# Patient Record
Sex: Female | Born: 1945 | Race: White | Hispanic: No | Smoking: Never smoker
Health system: Southern US, Community
[De-identification: ages and names within clinical notes are randomized; demographics above are authoritative.]

## PROBLEM LIST (undated history)

## (undated) DIAGNOSIS — M1990 Unspecified osteoarthritis, unspecified site: Secondary | ICD-10-CM

## (undated) DIAGNOSIS — K219 Gastro-esophageal reflux disease without esophagitis: Secondary | ICD-10-CM

## (undated) DIAGNOSIS — E079 Disorder of thyroid, unspecified: Secondary | ICD-10-CM

## (undated) DIAGNOSIS — R42 Dizziness and giddiness: Secondary | ICD-10-CM

## (undated) DIAGNOSIS — R001 Bradycardia, unspecified: Secondary | ICD-10-CM

## (undated) DIAGNOSIS — R0600 Dyspnea, unspecified: Secondary | ICD-10-CM

## (undated) HISTORY — DX: Gastro-esophageal reflux disease without esophagitis: K21.9

## (undated) HISTORY — DX: Unspecified osteoarthritis, unspecified site: M19.90

## (undated) HISTORY — DX: Dizziness and giddiness: R42

## (undated) HISTORY — PX: OTHER SURGICAL HISTORY: SHX169

## (undated) HISTORY — DX: Bradycardia, unspecified: R00.1

## (undated) HISTORY — DX: Dyspnea, unspecified: R06.00

## (undated) HISTORY — PX: SKIN CANCER EXCISION: SHX779

## (undated) HISTORY — PX: ABDOMINAL HYSTERECTOMY: SHX81

---

## 2000-11-06 ENCOUNTER — Ambulatory Visit (HOSPITAL_COMMUNITY): Admission: RE | Admit: 2000-11-06 | Discharge: 2000-11-06 | Payer: Self-pay | Admitting: Family Medicine

## 2000-11-06 ENCOUNTER — Encounter: Payer: Self-pay | Admitting: Family Medicine

## 2000-12-22 ENCOUNTER — Encounter: Payer: Self-pay | Admitting: *Deleted

## 2000-12-22 ENCOUNTER — Ambulatory Visit (HOSPITAL_COMMUNITY): Admission: RE | Admit: 2000-12-22 | Discharge: 2000-12-22 | Payer: Self-pay | Admitting: *Deleted

## 2001-09-06 ENCOUNTER — Ambulatory Visit (HOSPITAL_COMMUNITY): Admission: RE | Admit: 2001-09-06 | Discharge: 2001-09-06 | Payer: Self-pay | Admitting: Family Medicine

## 2001-09-06 ENCOUNTER — Encounter: Payer: Self-pay | Admitting: Family Medicine

## 2002-02-16 ENCOUNTER — Ambulatory Visit (HOSPITAL_COMMUNITY): Admission: RE | Admit: 2002-02-16 | Discharge: 2002-02-16 | Payer: Self-pay | Admitting: Family Medicine

## 2002-02-16 ENCOUNTER — Encounter: Payer: Self-pay | Admitting: Family Medicine

## 2002-06-01 ENCOUNTER — Encounter: Payer: Self-pay | Admitting: Family Medicine

## 2002-06-01 ENCOUNTER — Ambulatory Visit (HOSPITAL_COMMUNITY): Admission: RE | Admit: 2002-06-01 | Discharge: 2002-06-01 | Payer: Self-pay | Admitting: Family Medicine

## 2003-06-05 ENCOUNTER — Ambulatory Visit (HOSPITAL_COMMUNITY): Admission: RE | Admit: 2003-06-05 | Discharge: 2003-06-05 | Payer: Self-pay | Admitting: Family Medicine

## 2003-06-21 ENCOUNTER — Ambulatory Visit (HOSPITAL_COMMUNITY): Admission: RE | Admit: 2003-06-21 | Discharge: 2003-06-21 | Payer: Self-pay | Admitting: Family Medicine

## 2003-10-17 ENCOUNTER — Ambulatory Visit (HOSPITAL_COMMUNITY): Admission: RE | Admit: 2003-10-17 | Discharge: 2003-10-17 | Payer: Self-pay | Admitting: Diagnostic Radiology

## 2004-02-29 ENCOUNTER — Ambulatory Visit (HOSPITAL_COMMUNITY): Admission: RE | Admit: 2004-02-29 | Discharge: 2004-02-29 | Payer: Self-pay | Admitting: Family Medicine

## 2005-01-09 ENCOUNTER — Ambulatory Visit (HOSPITAL_COMMUNITY): Admission: RE | Admit: 2005-01-09 | Discharge: 2005-01-09 | Payer: Self-pay | Admitting: Family Medicine

## 2006-04-08 ENCOUNTER — Ambulatory Visit: Payer: Self-pay | Admitting: General Practice

## 2007-04-14 ENCOUNTER — Ambulatory Visit: Payer: Self-pay | Admitting: General Practice

## 2007-06-07 ENCOUNTER — Ambulatory Visit (HOSPITAL_COMMUNITY): Admission: RE | Admit: 2007-06-07 | Discharge: 2007-06-07 | Payer: Self-pay | Admitting: Family Medicine

## 2008-04-19 ENCOUNTER — Ambulatory Visit: Payer: Self-pay | Admitting: General Practice

## 2008-10-20 ENCOUNTER — Ambulatory Visit (HOSPITAL_COMMUNITY): Admission: RE | Admit: 2008-10-20 | Discharge: 2008-10-20 | Payer: Self-pay | Admitting: Internal Medicine

## 2009-04-24 ENCOUNTER — Ambulatory Visit: Payer: Self-pay | Admitting: General Practice

## 2010-01-02 ENCOUNTER — Encounter: Payer: Self-pay | Admitting: Internal Medicine

## 2010-01-21 ENCOUNTER — Ambulatory Visit: Payer: Self-pay | Admitting: Internal Medicine

## 2010-01-21 ENCOUNTER — Ambulatory Visit (HOSPITAL_COMMUNITY): Admission: RE | Admit: 2010-01-21 | Discharge: 2010-01-21 | Payer: Self-pay | Admitting: Internal Medicine

## 2010-01-21 HISTORY — PX: COLONOSCOPY W/ POLYPECTOMY: SHX1380

## 2010-01-26 ENCOUNTER — Encounter: Payer: Self-pay | Admitting: Internal Medicine

## 2010-02-18 ENCOUNTER — Encounter: Payer: Self-pay | Admitting: Internal Medicine

## 2010-04-16 ENCOUNTER — Ambulatory Visit: Payer: Self-pay | Admitting: General Practice

## 2010-07-14 ENCOUNTER — Encounter: Payer: Self-pay | Admitting: Family Medicine

## 2010-07-23 NOTE — Letter (Signed)
Summary: PATHOLOGY REPORT  PATHOLOGY REPORT   Imported By: Rexene Alberts 02/18/2010 10:40:43  _____________________________________________________________________  External Attachment:    Type:   Image     Comment:   External Document

## 2010-07-23 NOTE — Letter (Signed)
Summary: Patient Notice, Colon Biopsy Results  [facility]  [address]   [place], [place] [address]   Phone: [phone]  Fax: [phone]       January 26, 2010   Robin Preston [address] [place], [place]  [address] July 10, 1945    Dear Robin Preston,  I am pleased to inform you that the biopsies taken during your recent colonoscopy did not show any evidence of cancer upon pathologic examination.  Additional information/recommendations:  No further action is needed at this time.  Please follow-up with your primary care physician for your other healthcare needs.  You should have a repeat colonoscopy examination  in 7 years.  Please call us if you are having persistent problems or have questions about your condition that have not been fully answered at this time.  Sincerely,    R. Roetta Sessions MD, FACP [facility] Ph: [phone]    Fax: [phone]   Appended Document: Patient Notice, Colon Biopsy Results letter mailed to pt  Appended Document: Patient Notice, Colon Biopsy Results REMINDER IN COMPUTER

## 2010-07-23 NOTE — Letter (Signed)
Summary: Internal Other Domingo Dimes  Internal Other Domingo Dimes   Imported By: Cloria Spring LPN 04/54/0981 19:14:78  _____________________________________________________________________  External Attachment:    Type:   Image     Comment:   External Document

## 2010-07-23 NOTE — Letter (Signed)
Summary: OPERATIVE REPORT  OPERATIVE REPORT   Imported By: Rexene Alberts 02/18/2010 10:39:25  _____________________________________________________________________  External Attachment:    Type:   Image     Comment:   External Document

## 2010-10-02 ENCOUNTER — Ambulatory Visit: Payer: Self-pay | Admitting: Cardiology

## 2010-11-08 NOTE — Procedures (Signed)
Robin Preston, Robin Preston                          ACCOUNT NO.:  [id number]   MEDICAL RECORD NO.:  [id number]                   PATIENT TYPE:  OUT   LOCATION:  RESP                                 FACILITY:  [facility]   PHYSICIAN:  Edward L. Juanetta Gosling, M.D.             DATE OF BIRTH:  05/12/1946   DATE OF PROCEDURE:  DATE OF DISCHARGE:  02/29/2004                              PULMONARY FUNCTION TEST   RESULTS:  1.  Spirometry is normal.  2.  Lung volumes are minimally decreased.  Total lung capacity is minimally      decreased with normal being 80% and this patient's measurement is at      79.3.  3.  DLCO is mildly decreased.  4.  Arterial blood gases are normal.      ___________________________________________                                            Oneal Deputy. Juanetta Gosling, M.D.   ELH/MEDQ  D:  03/06/2004  T:  03/06/2004  Job:  [id number]

## 2011-04-15 ENCOUNTER — Ambulatory Visit: Payer: Self-pay

## 2011-05-06 ENCOUNTER — Ambulatory Visit: Payer: Self-pay

## 2011-09-21 ENCOUNTER — Encounter (HOSPITAL_COMMUNITY): Payer: Self-pay | Admitting: *Deleted

## 2011-09-21 ENCOUNTER — Emergency Department (HOSPITAL_COMMUNITY)
Admission: EM | Admit: 2011-09-21 | Discharge: 2011-09-21 | Disposition: A | Discharge status: Home / Self Care | Payer: BC Managed Care – PPO | Attending: Emergency Medicine | Admitting: Emergency Medicine

## 2011-09-21 ENCOUNTER — Other Ambulatory Visit: Payer: Self-pay

## 2011-09-21 ENCOUNTER — Emergency Department (HOSPITAL_COMMUNITY): Payer: BC Managed Care – PPO

## 2011-09-21 DIAGNOSIS — R0609 Other forms of dyspnea: Secondary | ICD-10-CM | POA: Insufficient documentation

## 2011-09-21 DIAGNOSIS — R079 Chest pain, unspecified: Secondary | ICD-10-CM | POA: Insufficient documentation

## 2011-09-21 DIAGNOSIS — R0602 Shortness of breath: Secondary | ICD-10-CM | POA: Insufficient documentation

## 2011-09-21 DIAGNOSIS — R0989 Other specified symptoms and signs involving the circulatory and respiratory systems: Secondary | ICD-10-CM | POA: Insufficient documentation

## 2011-09-21 DIAGNOSIS — R0600 Dyspnea, unspecified: Secondary | ICD-10-CM

## 2011-09-21 DIAGNOSIS — I498 Other specified cardiac arrhythmias: Secondary | ICD-10-CM | POA: Insufficient documentation

## 2011-09-21 HISTORY — DX: Disorder of thyroid, unspecified: E07.9

## 2011-09-21 LAB — COMPREHENSIVE METABOLIC PANEL
ALT: 19 U/L (ref 0–35)
AST: 18 U/L (ref 0–37)
CO2: 26 mEq/L (ref 19–32)
Calcium: 9.4 mg/dL (ref 8.4–10.5)
Chloride: 98 mEq/L (ref 96–112)
GFR calc Af Amer: >90 mL/min (ref >90)
GFR calc non Af Amer: 87 mL/min — ABNORMAL LOW (ref >90)
Glucose, Bld: 86 mg/dL (ref 70–99)
Sodium: 134 mEq/L — ABNORMAL LOW (ref 135–145)
Total Bilirubin: 0.2 mg/dL — ABNORMAL LOW (ref 0.3–1.2)

## 2011-09-21 LAB — DIFFERENTIAL
Basophils Absolute: 0 10*3/uL (ref 0.0–0.1)
Eosinophils Relative: 0 % (ref 0–5)
Lymphocytes Relative: 34 % (ref 12–46)
Lymphs Abs: 1.6 10*3/uL (ref 0.7–4.0)
Monocytes Absolute: 0.4 10*3/uL (ref 0.1–1.0)
Neutro Abs: 2.6 10*3/uL (ref 1.7–7.7)

## 2011-09-21 LAB — CBC
HCT: 37 % (ref 36.0–46.0)
MCV: 87.7 fL (ref 78.0–100.0)
Platelets: 203 10*3/uL (ref 150–400)
RBC: 4.22 MIL/uL (ref 3.87–5.11)
WBC: 4.7 10*3/uL (ref 4.0–10.5)

## 2011-09-21 LAB — TROPONIN I: Troponin I: <0.3 ng/mL (ref <0.30)

## 2011-09-21 NOTE — ED Provider Notes (Signed)
History     CSN: [id number]  Arrival date & time 09/21/11  1552   First MD Initiated Contact with Patient 09/21/11 1616      Chief Complaint  Patient presents with  . Shortness of Breath    (Consider location/radiation/quality/duration/timing/severity/associated sxs/prior treatment) Patient is a 66 y.o. female presenting with shortness of breath. The history is provided by the patient (the patient states that she has been having shortness of breath for a week. The patient states shortness of breath is worse with exertion. She's not having any chest pain.). No language interpreter was used.  Shortness of Breath  The current episode started 5 to 7 days ago. The problem occurs frequently. The problem has been unchanged. The problem is moderate. The symptoms are relieved by nothing. The symptoms are aggravated by activity. Associated symptoms include chest pain and shortness of breath. Pertinent negatives include no cough. She was not exposed to toxic fumes. She has had no prior steroid use. Her past medical history does not include asthma. She has been behaving normally.    Past Medical History  Diagnosis Date  . Thyroid disease     Past Surgical History  Procedure Date  . Abdominal hysterectomy     No family history on file.  History  Substance Use Topics  . Smoking status: Never Smoker   . Smokeless tobacco: Not on file  . Alcohol Use: No    OB History    Grav Para Term Preterm Abortions TAB SAB Ect Mult Living                  Review of Systems  Constitutional: Negative for fatigue.  HENT: Negative for congestion, sinus pressure and ear discharge.   Eyes: Negative for discharge.  Respiratory: Positive for shortness of breath. Negative for cough.   Cardiovascular: Positive for chest pain.  Gastrointestinal: Negative for abdominal pain and diarrhea.  Genitourinary: Negative for frequency and hematuria.  Musculoskeletal: Negative for back pain.  Skin: Negative for  rash.  Neurological: Negative for seizures and headaches.  Hematological: Negative.   Psychiatric/Behavioral: Negative for hallucinations.    Allergies  Sulfa antibiotics  Home Medications   Current Outpatient Rx  Name Route Sig Dispense Refill  . ESTROGENS CONJUGATED 0.625 MG PO TABS Oral Take 0.625 mg by mouth daily. Take daily for 21 days then do not take for 7 days.    Marland Kitchen METHIMAZOLE 10 MG PO TABS Oral Take 10 mg by mouth daily.      BP 110/64  Pulse 52  Temp(Src) 97.5 F (36.4 C) (Oral)  Resp 18  Ht 5\' 1"  (1.549 m)  Wt 92 lb (41.731 kg)  BMI 17.38 kg/m2  SpO2 100%  Physical Exam  Constitutional: She is oriented to person, place, and time. She appears well-developed.  HENT:  Head: Normocephalic and atraumatic.  Eyes: Conjunctivae and EOM are normal. No scleral icterus.  Neck: Neck supple. No thyromegaly present.  Cardiovascular: Normal rate and regular rhythm.  Exam reveals no gallop and no friction rub.   No murmur heard. Pulmonary/Chest: No stridor. She has no wheezes. She has no rales. She exhibits no tenderness.  Abdominal: She exhibits no distension. There is no tenderness. There is no rebound.  Musculoskeletal: Normal range of motion. She exhibits no edema.  Lymphadenopathy:    She has no cervical adenopathy.  Neurological: She is oriented to person, place, and time. Coordination normal.  Skin: No rash noted. No erythema.  Psychiatric: She has a normal mood  and affect. Her behavior is normal.    ED Course  Procedures (including critical care time)  Labs Reviewed  COMPREHENSIVE METABOLIC PANEL - Abnormal; Notable for the following:    Sodium 134 (*)    Total Bilirubin 0.2 (*)    GFR calc non Af Amer 87 (*)    All other components within normal limits  D-DIMER, QUANTITATIVE  TROPONIN I  CBC  DIFFERENTIAL   Dg Chest 2 View  09/21/2011  *RADIOLOGY REPORT*  Clinical Data: Shortness of breath  CHEST - 2 VIEW  Comparison: None.  Findings: The heart size  and mediastinal contours are within normal limits.  Both lungs are clear.  The visualized skeletal structures are unremarkable.  IMPRESSION:  Negative exam.  Original Report Authenticated By: Rosealee Albee, M.D.   Pt told that we should admit her and possibly do a treadmill test.  She refused admission  1. Dyspnea      Date: 09/21/2011  Rate: 48  Rhythm: sinus bradycardia  QRS Axis: normal  Intervals: normal  ST/T Wave abnormalities: normal  Conduction Disutrbances:none  Narrative Interpretation:   Old EKG Reviewed: unchanged \  MDM   Dyspnea unknown cause.  Pt refusing admission      Benny Lennert, MD 09/21/11 [phone]

## 2011-09-21 NOTE — ED Notes (Signed)
Pt c/o shortness of breath x 1 1/2 weeks. Pt states that she had vomiting and diarrhea several days ago but this started prior to that. Pt alert and oriented x 3. Skin warm and dry. Color pink. Breath sounds clear and equal bilaterally. Cardiac monitor showing SB with rate of 47. Denies cough and congestion.

## 2011-09-21 NOTE — ED Notes (Signed)
C/o increased sob x 1 wk.  Denies cough, denies fever.  Speaking clear full sentences in triage, RR wnl and unlabored.

## 2011-09-21 NOTE — Discharge Instructions (Signed)
Follow up with your md this week.  Return if any problems °

## 2012-03-09 ENCOUNTER — Ambulatory Visit: Payer: BC Managed Care – PPO | Admitting: Cardiology

## 2012-03-16 ENCOUNTER — Encounter: Payer: Self-pay | Admitting: Cardiology

## 2012-03-16 ENCOUNTER — Encounter: Payer: Self-pay | Admitting: *Deleted

## 2012-03-16 ENCOUNTER — Ambulatory Visit (INDEPENDENT_AMBULATORY_CARE_PROVIDER_SITE_OTHER): Payer: BC Managed Care – PPO | Admitting: Cardiology

## 2012-03-16 VITALS — BP 120/70 | HR 49 | Ht 61.0 in | Wt 96.0 lb

## 2012-03-16 DIAGNOSIS — Z9289 Personal history of other medical treatment: Secondary | ICD-10-CM | POA: Insufficient documentation

## 2012-03-16 DIAGNOSIS — R0989 Other specified symptoms and signs involving the circulatory and respiratory systems: Secondary | ICD-10-CM

## 2012-03-16 DIAGNOSIS — R001 Bradycardia, unspecified: Secondary | ICD-10-CM

## 2012-03-16 DIAGNOSIS — R0600 Dyspnea, unspecified: Secondary | ICD-10-CM

## 2012-03-16 DIAGNOSIS — R42 Dizziness and giddiness: Secondary | ICD-10-CM

## 2012-03-16 DIAGNOSIS — I498 Other specified cardiac arrhythmias: Secondary | ICD-10-CM

## 2012-03-16 NOTE — Patient Instructions (Addendum)
Your physician recommends that you schedule a follow-up appointment in: As needed   Your physician has requested that you have a stress echocardiogram. For further information please visit [email or web address]. Please follow instruction sheet as given.    

## 2012-03-16 NOTE — Progress Notes (Deleted)
Name: Robin Preston    DOB: [birth year]  Age: 66 y.o.  MR#: [id number]       PCP:  Cassell Smiles., MD      Insurance: @PAYORNAME @   CC:   No chief complaint on file.   VS BP 120/70  Pulse 49  Ht 5\' 1"  (1.549 m)  Wt 96 lb (43.545 kg)  BMI 18.14 kg/m2  SpO2 95%  Weights Current Weight  03/16/12 96 lb (43.545 kg)  09/21/11 92 lb (41.731 kg)    Blood Pressure  BP Readings from Last 3 Encounters:  03/16/12 120/70  09/21/11 110/64     Admit date:  (Not on file) Last encounter with RMR:  Visit date not found   Allergy Allergies  Allergen Reactions  . Sulfa Antibiotics Rash    Current Outpatient Prescriptions  Medication Sig Dispense Refill  . estrogens, conjugated, (PREMARIN) 0.625 MG tablet Take 0.625 mg by mouth daily. Take daily for 21 days then do not take for 7 days.      Marland Kitchen HYDROcodone-acetaminophen (NORCO/VICODIN) 5-325 MG per tablet Take 1 tablet by mouth every 6 (six) hours as needed.      . methimazole (TAPAZOLE) 10 MG tablet Take 10 mg by mouth daily.        Discontinued Meds:   There are no discontinued medications.  Patient Active Problem List  Diagnosis  . History of diagnostic tests    LABS No visits with results within 3 Month(s) from this visit. Latest known visit with results is:  Admission on 09/21/2011, Discharged on 09/21/2011  Component Date Value  . D-Dimer, Quant 09/21/2011 <0.22   . Troponin I 09/21/2011 <0.30   . WBC 09/21/2011 4.7   . RBC 09/21/2011 4.22   . Hemoglobin 09/21/2011 12.8   . HCT 09/21/2011 37.0   . MCV 09/21/2011 87.7   . [facility] 09/21/2011 30.3   . MCHC 09/21/2011 34.6   . RDW 09/21/2011 12.5   . Platelets 09/21/2011 203   . Neutrophils Relative 09/21/2011 56   . Neutro Abs 09/21/2011 2.6   . Lymphocytes Relative 09/21/2011 34   . Lymphs Abs 09/21/2011 1.6   . Monocytes Relative 09/21/2011 9   . Monocytes Absolute 09/21/2011 0.4   . Eosinophils Relative 09/21/2011 0   . Eosinophils Absolute 09/21/2011 0.0   .  Basophils Relative 09/21/2011 1   . Basophils Absolute 09/21/2011 0.0   . Sodium 09/21/2011 134*  . Potassium 09/21/2011 4.0   . Chloride 09/21/2011 98   . CO2 09/21/2011 26   . Glucose, Bld 09/21/2011 86   . BUN 09/21/2011 12   . Creatinine, Ser 09/21/2011 0.72   . Calcium 09/21/2011 9.4   . Total Protein 09/21/2011 6.6   . Albumin 09/21/2011 3.5   . AST 09/21/2011 18   . ALT 09/21/2011 19   . Alkaline Phosphatase 09/21/2011 44   . Total Bilirubin 09/21/2011 0.2*  . GFR calc non Af Amer 09/21/2011 87*  . GFR calc Af Amer 09/21/2011 >90      Results for this Opt Visit:     Results for orders placed during the hospital encounter of 09/21/11  D-DIMER, QUANTITATIVE      Component Value Range   D-Dimer, Quant <0.22  0.00 - 0.48 ug/mL-FEU  TROPONIN I      Component Value Range   Troponin I <0.30  <0.30 ng/mL  CBC      Component Value Range   WBC 4.7  4.0 - 10.5 K/uL  RBC 4.22  3.87 - 5.11 MIL/uL   Hemoglobin 12.8  12.0 - 15.0 g/dL   HCT 40.9  81.1 - 91.4 %   MCV 87.7  78.0 - 100.0 fL   MCH 30.3  26.0 - 34.0 pg   MCHC 34.6  30.0 - 36.0 g/dL   RDW 78.2  95.6 - 21.3 %   Platelets 203  150 - 400 K/uL  DIFFERENTIAL      Component Value Range   Neutrophils Relative 56  43 - 77 %   Neutro Abs 2.6  1.7 - 7.7 K/uL   Lymphocytes Relative 34  12 - 46 %   Lymphs Abs 1.6  0.7 - 4.0 K/uL   Monocytes Relative 9  3 - 12 %   Monocytes Absolute 0.4  0.1 - 1.0 K/uL   Eosinophils Relative 0  0 - 5 %   Eosinophils Absolute 0.0  0.0 - 0.7 K/uL   Basophils Relative 1  0 - 1 %   Basophils Absolute 0.0  0.0 - 0.1 K/uL  COMPREHENSIVE METABOLIC PANEL      Component Value Range   Sodium 134 (*) 135 - 145 mEq/L   Potassium 4.0  3.5 - 5.1 mEq/L   Chloride 98  96 - 112 mEq/L   CO2 26  19 - 32 mEq/L   Glucose, Bld 86  70 - 99 mg/dL   BUN 12  6 - 23 mg/dL   Creatinine, Ser 0.86  0.50 - 1.10 mg/dL   Calcium 9.4  8.4 - 57.8 mg/dL   Total Protein 6.6  6.0 - 8.3 g/dL   Albumin 3.5  3.5 - 5.2  g/dL   AST 18  0 - 37 U/L   ALT 19  0 - 35 U/L   Alkaline Phosphatase 44  39 - 117 U/L   Total Bilirubin 0.2 (*) 0.3 - 1.2 mg/dL   GFR calc non Af Amer 87 (*) >90 mL/min   GFR calc Af Amer >90  >90 mL/min    EKG Orders placed during the hospital encounter of 09/21/11  . ED EKG  . ED EKG  . EKG     Prior Assessment and Plan Problem List as of 03/16/2012          History of diagnostic tests       Imaging: No results found.   FRS Calculation: Score not calculated. Missing: Total Cholesterol, HDL

## 2012-03-17 ENCOUNTER — Encounter: Payer: Self-pay | Admitting: Cardiology

## 2012-03-17 DIAGNOSIS — R42 Dizziness and giddiness: Secondary | ICD-10-CM | POA: Insufficient documentation

## 2012-03-17 DIAGNOSIS — R001 Bradycardia, unspecified: Secondary | ICD-10-CM | POA: Insufficient documentation

## 2012-03-17 DIAGNOSIS — R0600 Dyspnea, unspecified: Secondary | ICD-10-CM | POA: Insufficient documentation

## 2012-03-17 NOTE — Progress Notes (Signed)
Patient ID: Robin Preston, female   DOB: 10-26-1945, 66 y.o.   MRN: [id number]  HPI: Initial Cardiology evaluation performed at the kind request of Dr. Sherwood Gambler for evaluation of bradycardia.  [name] has no known cardiac disease, but was assessed 9 years ago by a female cardiologist whose name she cannot recall, likely either Dr. Fraser Din or Dr. Domingo Sep, and underwent noninvasive testing that apparently included a stress test and echocardiogram.  She was not told of any significant heart disease and was advised that no therapy was needed for her asymptomatic bradycardia.   In recent weeks, she describes "dizziness", but this does not appear to be either presyncope or vertigo.  She reports a "odd sensation in her head" with dysequilibrium, but no falls.  Episodes are generally brief allowing her to rest at work for few minutes and then to proceed without difficulty.  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     She also reports class 2-3 dyspnea on exertion, but it sounds more like chest heaviness and aching than true air hunger.  That symptom has been present for more than 5 years without much change.  A heart rate in the 40s was recently noted, and blood pressure has not infrequently been in the 90s systolic, but it is not clear that these physiologic events are associated with her symptoms.  Current Outpatient Prescriptions on File Prior to Visit  Medication Sig Dispense Refill  . estrogens, conjugated, (PREMARIN) 0.625 MG tablet Take 0.625 mg by mouth daily. Take daily for 21 days then do not take for 7 days.      . methimazole (TAPAZOLE) 10 MG tablet Take 10 mg by mouth daily.       Allergies  Allergen Reactions  . Sulfa Antibiotics Rash    Past Medical History  Diagnosis Date  . Thyroid disease   . Bradycardia   . Generalized osteoarthrosis, involving multiple sites     Past Surgical History  Procedure Date  . Abdominal  hysterectomy   . Colonoscopy w/ polypectomy 01/2010    internal hemorrhoids;9 mm rectal tubular adenoma  . Skin cancer excision     Nose    History reviewed. No pertinent family history.   History   Social History  . Marital Status: Divorced    Spouse Name: N/A    Number of Children: N/A  . Years of Education: N/A   Occupational History  . Not on file.   Social History Main Topics  . Smoking status: Never Smoker   . Smokeless tobacco: Not on file  . Alcohol Use: No  . Drug Use: No  . Sexually Active:    Other Topics Concern  . Not on file   Social History Narrative  . No narrative on file  ROS:  Patient notes some arthritic discomfort in her knees.  All other systems reviewed and are negative.  PHYSICAL EXAM: BP 120/70  Pulse 49  Ht 5\' 1"  (1.549 m)  Wt 43.545 kg (96 lb)  BMI 18.14 kg/m2  SpO2 95%   General-Well-developed; no acute distress Body Habitus-proportionate weight and height HEENT-[place]/AT; PERRL; EOM intact; conjunctiva and lids nl Neck-No JVD; no carotid bruits Endocrine-No thyromegaly Lungs-Clear lung fields; resonant percussion; normal I-to-E ratio Cardiovascular- normal PMI; normal S1 and S2 Abdomen-BS normal; soft and non-tender without masses or organomegaly Musculoskeletal-No deformities, cyanosis or clubbing Neurologic-Nl cranial nerves; symmetric strength and tone Skin- Warm, no significant lesions Extremities-Nl distal pulses;  no edema  EKG:  Tracing performed 02/18/12 obtained and reviewed.  Sinus bradycardia present at a heart rate of 44 bpm; minor nonspecific ST segment abnormality; otherwise normal. Rhythm Strip: sinus bradycardia at a rate of 51 bpm.  ASSESSMENT AND PLAN:  [place] Bing, MD 03/17/2012 10:41 AM

## 2012-03-17 NOTE — Assessment & Plan Note (Addendum)
Dyspnea is chronic and likely does not reflect significant cardiac or pulmonary disease.  Basic testing will be performed to include an echocardiogram, stress test, and a chest x-ray.  Recent laboratory studies are unremarkable.

## 2012-03-17 NOTE — Assessment & Plan Note (Signed)
The nature of her dizziness is poorly defined.  No orthostatic change was seen in blood pressure at this visit.  The symptoms are likely neurologic in origin or perhaps related to sinus or other local issues.

## 2012-03-17 NOTE — Assessment & Plan Note (Signed)
Patient has long-standing sinus bradycardia that is likely asymptomatic.event recording may ultimately be necessary, but will be deferred for the time being.

## 2012-03-30 ENCOUNTER — Ambulatory Visit (HOSPITAL_COMMUNITY)
Admission: RE | Admit: 2012-03-30 | Discharge: 2012-03-30 | Disposition: A | Discharge status: Home / Self Care | Payer: BC Managed Care – PPO | Source: Ambulatory Visit | Attending: Cardiology | Admitting: Cardiology

## 2012-03-30 ENCOUNTER — Encounter (HOSPITAL_COMMUNITY): Payer: Self-pay | Admitting: Cardiology

## 2012-03-30 DIAGNOSIS — R0989 Other specified symptoms and signs involving the circulatory and respiratory systems: Secondary | ICD-10-CM | POA: Insufficient documentation

## 2012-03-30 DIAGNOSIS — R001 Bradycardia, unspecified: Secondary | ICD-10-CM

## 2012-03-30 DIAGNOSIS — R42 Dizziness and giddiness: Secondary | ICD-10-CM

## 2012-03-30 DIAGNOSIS — R0609 Other forms of dyspnea: Secondary | ICD-10-CM | POA: Insufficient documentation

## 2012-03-30 DIAGNOSIS — R0602 Shortness of breath: Secondary | ICD-10-CM

## 2012-03-30 DIAGNOSIS — I498 Other specified cardiac arrhythmias: Secondary | ICD-10-CM | POA: Insufficient documentation

## 2012-03-30 NOTE — Progress Notes (Signed)
Stress Lab Nurses Notes - [facility]  NATASHA BURDA 03/30/2012 Reason for doing test: Bradycardia & dizziness Type of test: Marlane Hatcher Nurse performing test: Parke Poisson, RN Nuclear Medicine Tech: Not Applicable Echo Tech: Karrie Doffing MD performing test: Ival Bible &  Rhonda barrett PA Family MD: [place] Test explained and consent signed: yes IV started: No IV started Symptoms: Fatigue Treatment/Intervention: None Reason test stopped: reached target HR After recovery IV was: NA Patient to return to Nuc. Med at : NA Patient discharged: Home Patient's Condition upon discharge was: stable Comments: During test BP 156/75 & HR 133.   Recovery BP 130/85 & HR 57.  Symptoms resolved in recovery. Erskine Speed T

## 2012-03-30 NOTE — Progress Notes (Signed)
*  PRELIMINARY RESULTS* Echocardiogram 2D Echocardiogram has been performed.  Conrad Eustace 03/30/2012, 11:00 AM

## 2012-04-01 ENCOUNTER — Encounter: Payer: Self-pay | Admitting: *Deleted

## 2012-04-13 ENCOUNTER — Ambulatory Visit: Payer: Self-pay

## 2012-04-21 ENCOUNTER — Emergency Department (HOSPITAL_COMMUNITY)
Admission: EM | Admit: 2012-04-21 | Discharge: 2012-04-21 | Disposition: A | Discharge status: Home / Self Care | Payer: BC Managed Care – PPO | Attending: Emergency Medicine | Admitting: Emergency Medicine

## 2012-04-21 ENCOUNTER — Encounter (HOSPITAL_COMMUNITY): Payer: Self-pay | Admitting: *Deleted

## 2012-04-21 DIAGNOSIS — Y939 Activity, unspecified: Secondary | ICD-10-CM | POA: Insufficient documentation

## 2012-04-21 DIAGNOSIS — E079 Disorder of thyroid, unspecified: Secondary | ICD-10-CM | POA: Insufficient documentation

## 2012-04-21 DIAGNOSIS — M79646 Pain in unspecified finger(s): Secondary | ICD-10-CM

## 2012-04-21 DIAGNOSIS — Z8669 Personal history of other diseases of the nervous system and sense organs: Secondary | ICD-10-CM | POA: Insufficient documentation

## 2012-04-21 DIAGNOSIS — M1990 Unspecified osteoarthritis, unspecified site: Secondary | ICD-10-CM | POA: Insufficient documentation

## 2012-04-21 DIAGNOSIS — Z8709 Personal history of other diseases of the respiratory system: Secondary | ICD-10-CM | POA: Insufficient documentation

## 2012-04-21 DIAGNOSIS — Z23 Encounter for immunization: Secondary | ICD-10-CM | POA: Insufficient documentation

## 2012-04-21 DIAGNOSIS — S60469A Insect bite (nonvenomous) of unspecified finger, initial encounter: Secondary | ICD-10-CM | POA: Insufficient documentation

## 2012-04-21 DIAGNOSIS — Z8679 Personal history of other diseases of the circulatory system: Secondary | ICD-10-CM | POA: Insufficient documentation

## 2012-04-21 DIAGNOSIS — Z79899 Other long term (current) drug therapy: Secondary | ICD-10-CM | POA: Insufficient documentation

## 2012-04-21 DIAGNOSIS — Y929 Unspecified place or not applicable: Secondary | ICD-10-CM | POA: Insufficient documentation

## 2012-04-21 DIAGNOSIS — W57XXXA Bitten or stung by nonvenomous insect and other nonvenomous arthropods, initial encounter: Secondary | ICD-10-CM | POA: Insufficient documentation

## 2012-04-21 MED ORDER — TETANUS-DIPHTH-ACELL PERTUSSIS 5-2.5-18.5 LF-MCG/0.5 IM SUSP
0.5000 mL | Freq: Once | INTRAMUSCULAR | Status: AC
  Administered 2012-04-21: 0.5 mL via INTRAMUSCULAR
  Filled 2012-04-21: qty 0.5

## 2012-04-21 MED ORDER — HYDROMORPHONE HCL PF 1 MG/ML IJ SOLN
1.0000 mg | Freq: Once | INTRAMUSCULAR | Status: AC
  Administered 2012-04-21: 1 mg via INTRAMUSCULAR
  Filled 2012-04-21: qty 1

## 2012-04-21 MED ORDER — OXYCODONE-ACETAMINOPHEN 5-325 MG PO TABS: 1.0000 | ORAL_TABLET | Freq: Four times a day (QID) | ORAL | Status: AC | PRN

## 2012-04-21 MED ORDER — DOXYCYCLINE HYCLATE 100 MG PO CAPS: 100.0000 mg | ORAL_CAPSULE | Freq: Two times a day (BID) | ORAL | Status: DC

## 2012-04-21 NOTE — ED Notes (Signed)
Discharge instructions reviewed with pt, questions answered. Pt verbalized understanding.  

## 2012-04-21 NOTE — ED Provider Notes (Signed)
History     CSN: [id number]  Arrival date & time 04/21/12  2135   First MD Initiated Contact with Patient 04/21/12 2255      Chief Complaint  Patient presents with  . Insect Bite    (Consider location/radiation/quality/duration/timing/severity/associated sxs/prior treatment) Patient is a 66 y.o. female presenting with hand pain. The history is provided by the patient (pt was bit by an insect to her left ring finger ). No language interpreter was used.  Hand Pain This is a new problem. The current episode started 3 to 5 hours ago. The problem occurs constantly. The problem has not changed since onset.Pertinent negatives include no chest pain, no abdominal pain and no headaches. Nothing aggravates the symptoms. Nothing relieves the symptoms.    Past Medical History  Diagnosis Date  . Thyroid disease   . Sinus bradycardia   . Degenerative joint disease   . Dyspnea   . Dizziness     Past Surgical History  Procedure Date  . Abdominal hysterectomy   . Colonoscopy w/ polypectomy 01/2010    internal hemorrhoids;9 mm rectal tubular adenoma  . Skin cancer excision     Nose    No family history on file.  History  Substance Use Topics  . Smoking status: Never Smoker   . Smokeless tobacco: Not on file  . Alcohol Use: No    OB History    Grav Para Term Preterm Abortions TAB SAB Ect Mult Living                  Review of Systems  Constitutional: Negative for fatigue.  HENT: Negative for congestion, sinus pressure and ear discharge.   Eyes: Negative for discharge.  Respiratory: Negative for cough.   Cardiovascular: Negative for chest pain.  Gastrointestinal: Negative for abdominal pain and diarrhea.  Genitourinary: Negative for frequency and hematuria.  Musculoskeletal: Negative for back pain.       Hand pain  Skin: Negative for rash.  Neurological: Negative for seizures and headaches.  Hematological: Negative.   Psychiatric/Behavioral: Negative for hallucinations.      Allergies  Sulfa antibiotics  Home Medications   Current Outpatient Rx  Name Route Sig Dispense Refill  . DIPHENHYDRAMINE HCL 25 MG PO CAPS Oral Take 50 mg by mouth once as needed. For allergic reaction    . ESTROGENS CONJUGATED 0.625 MG PO TABS Oral Take 0.625 mg by mouth every other day.     Marland Kitchen HYDROCODONE-ACETAMINOPHEN 5-325 MG PO TABS Oral Take 1 tablet by mouth every 6 (six) hours as needed. For pain    . METHIMAZOLE 10 MG PO TABS Oral Take 10 mg by mouth every morning.       BP 170/76  Pulse 53  Temp 97.4 F (36.3 C) (Oral)  Resp 20  Ht 5\' 1"  (1.549 m)  Wt 96 lb (43.545 kg)  BMI 18.14 kg/m2  SpO2 100%  Physical Exam  Constitutional: She is oriented to person, place, and time. She appears well-developed.  HENT:  Head: Normocephalic.  Eyes: Conjunctivae normal are normal.  Neck: No tracheal deviation present.  Cardiovascular:  No murmur heard. Musculoskeletal:       Pain swelling left ring finger  Neurological: She is oriented to person, place, and time.  Skin: Skin is warm.  Psychiatric: She has a normal mood and affect.    ED Course  Procedures (including critical care time)  Labs Reviewed - No data to display No results found.   No diagnosis found.  MDM          Benny Lennert, MD 04/21/12 [phone]

## 2012-04-21 NOTE — ED Notes (Signed)
Insect bite to left middle with swelling and extreme pain, took 2 benadryls and a vicodin prior to arrival without relief

## 2012-04-21 NOTE — ED Notes (Signed)
MD at bedside. 

## 2012-09-21 ENCOUNTER — Encounter (HOSPITAL_COMMUNITY): Payer: Self-pay | Admitting: Pharmacy Technician

## 2012-09-27 ENCOUNTER — Encounter: Payer: Self-pay | Admitting: Cardiology

## 2012-09-27 NOTE — Patient Instructions (Addendum)
Trinity Haun Furnish  09/27/2012   Your procedure is scheduled on:  10/04/08  Report to [facility] at 1000 AM.  Call this number if you have problems the morning of surgery: [phone]   Remember:   Do not eat food or drink liquids after midnight.   Take these medicines the morning of surgery with A SIP OF WATER: methimazole, norco   Do not wear jewelry, make-up or nail polish.  Do not wear lotions, powders, or perfumes. You may wear deodorant.  Do not shave 48 hours prior to surgery. Men may shave face and neck.  Do not bring valuables to the hospital.  Contacts, dentures or bridgework may not be worn into surgery.  Leave suitcase in the car. After surgery it may be brought to your room.  For patients admitted to the hospital, checkout time is 11:00 AM the day of  discharge.   Patients discharged the day of surgery will not be allowed to drive  home.  Name and phone number of your driver: family  Special Instructions: N/A   Please read over the following fact sheets that you were given: Anesthesia Post-op Instructions and Care and Recovery After Surgery   PATIENT INSTRUCTIONS POST-ANESTHESIA  IMMEDIATELY FOLLOWING SURGERY:  Do not drive or operate machinery for the first twenty four hours after surgery.  Do not make any important decisions for twenty four hours after surgery or while taking narcotic pain medications or sedatives.  If you develop intractable nausea and vomiting or a severe headache please notify your doctor immediately.  FOLLOW-UP:  Please make an appointment with your surgeon as instructed. You do not need to follow up with anesthesia unless specifically instructed to do so.  WOUND CARE INSTRUCTIONS (if applicable):  Keep a dry clean dressing on the anesthesia/puncture wound site if there is drainage.  Once the wound has quit draining you may leave it open to air.  Generally you should leave the bandage intact for twenty four hours unless there is drainage.  If the epidural  site drains for more than 36-48 hours please call the anesthesia department.  QUESTIONS?:  Please feel free to call your physician or the hospital operator if you have any questions, and they will be happy to assist you.      Cataract Surgery  A cataract is a clouding of the lens of the eye. When a lens becomes cloudy, vision is reduced based on the degree and nature of the clouding. Surgery may be needed to improve vision. Surgery removes the cloudy lens and usually replaces it with a substitute lens (intraocular lens, IOL). LET YOUR EYE DOCTOR KNOW ABOUT:  Allergies to food or medicine.  Medicines taken including herbs, eyedrops, over-the-counter medicines, and creams.  Use of steroids (by mouth or creams).  Previous problems with anesthetics or numbing medicine.  History of bleeding problems or blood clots.  Previous surgery.  Other health problems, including diabetes and kidney problems.  Possibility of pregnancy, if this applies. RISKS AND COMPLICATIONS  Infection.  Inflammation of the eyeball (endophthalmitis) that can spread to both eyes (sympathetic ophthalmia).  Poor wound healing.  If an IOL is inserted, it can later fall out of proper position. This is very uncommon.  Clouding of the part of your eye that holds an IOL in place. This is called an "after-cataract." These are uncommon, but easily treated. BEFORE THE PROCEDURE  Do not eat or drink anything except small amounts of water for 8 to 12 before your surgery,  or as directed by your caregiver.  Unless you are told otherwise, continue any eyedrops you have been prescribed.  Talk to your primary caregiver about all other medicines that you take (both prescription and non-prescription). In some cases, you may need to stop or change medicines near the time of your surgery. This is most important if you are taking blood-thinning medicine.Do not stop medicines unless you are told to do so.  Arrange for someone to  drive you to and from the procedure.  Do not put contact lenses in either eye on the day of your surgery. PROCEDURE There is more than one method for safely removing a cataract. Your doctor can explain the differences and help determine which is best for you. Phacoemulsification surgery is the most common form of cataract surgery.  An injection is given behind the eye or eyedrops are given to make this a painless procedure.  A small cut (incision) is made on the edge of the clear, dome-shaped surface that covers the front of the eye (cornea).  A tiny probe is painlessly inserted into the eye. This device gives off ultrasound waves that soften and break up the cloudy center of the lens. This makes it easier for the cloudy lens to be removed by suction.  An IOL may be implanted.  The normal lens of the eye is covered by a clear capsule. Part of that capsule is intentionally left in the eye to support the IOL.  Your surgeon may or may not use stitches to close the incision. There are other forms of cataract surgery that require a larger incision and stiches to close the eye. This approach is taken in cases where the doctor feels that the cataract cannot be easily removed using phacoemulsification. AFTER THE PROCEDURE  When an IOL is implanted, it does not need care. It becomes a permanent part of your eye and cannot be seen or felt.  Your doctor will schedule follow-up exams to check on your progress.  Review your other medicines with your doctor to see which can be resumed after surgery.  Use eyedrops or take medicine as prescribed by your doctor. Document Released: 05/29/2011 Document Revised: 09/01/2011 Document Reviewed: 05/29/2011 [facility] Patient Information 2013 [place], [place].

## 2012-09-28 ENCOUNTER — Encounter (HOSPITAL_COMMUNITY): Payer: Self-pay

## 2012-09-28 ENCOUNTER — Encounter (HOSPITAL_COMMUNITY)
Admission: RE | Admit: 2012-09-28 | Discharge: 2012-09-28 | Disposition: A | Discharge status: Home / Self Care | Payer: PRIVATE HEALTH INSURANCE | Source: Ambulatory Visit | Attending: Ophthalmology | Admitting: Ophthalmology

## 2012-10-01 MED ORDER — CYCLOPENTOLATE-PHENYLEPHRINE 0.2-1 % OP SOLN: 1.0000 [drp] | OPHTHALMIC | Status: AC

## 2012-10-01 MED ORDER — PHENYLEPHRINE HCL 2.5 % OP SOLN: 1.0000 [drp] | OPHTHALMIC | Status: AC

## 2012-10-01 MED ORDER — LIDOCAINE HCL 3.5 % OP GEL: 1.0000 "application " | Freq: Once | OPHTHALMIC | Status: DC

## 2012-10-01 MED ORDER — KETOROLAC TROMETHAMINE 0.5 % OP SOLN: 1.0000 [drp] | OPHTHALMIC | Status: AC

## 2012-10-01 MED ORDER — TETRACAINE HCL 0.5 % OP SOLN: 1.0000 [drp] | OPHTHALMIC | Status: AC

## 2012-10-04 ENCOUNTER — Ambulatory Visit (HOSPITAL_COMMUNITY)
Admission: RE | Admit: 2012-10-04 | Discharge: 2012-10-04 | Disposition: A | Discharge status: Home / Self Care | Payer: PRIVATE HEALTH INSURANCE | Source: Ambulatory Visit | Attending: Ophthalmology | Admitting: Ophthalmology

## 2012-10-04 ENCOUNTER — Encounter (HOSPITAL_COMMUNITY): Payer: Self-pay | Admitting: *Deleted

## 2012-10-04 ENCOUNTER — Encounter (HOSPITAL_COMMUNITY): Payer: Self-pay | Admitting: Anesthesiology

## 2012-10-04 ENCOUNTER — Encounter (HOSPITAL_COMMUNITY)
Admission: RE | Disposition: A | Discharge status: Home / Self Care | Payer: Self-pay | Source: Ambulatory Visit | Attending: Ophthalmology

## 2012-10-04 ENCOUNTER — Ambulatory Visit (HOSPITAL_COMMUNITY): Payer: PRIVATE HEALTH INSURANCE | Admitting: Anesthesiology

## 2012-10-04 DIAGNOSIS — H2589 Other age-related cataract: Secondary | ICD-10-CM | POA: Insufficient documentation

## 2012-10-04 HISTORY — PX: CATARACT EXTRACTION W/PHACO: SHX586

## 2012-10-04 SURGERY — PHACOEMULSIFICATION, CATARACT, WITH IOL INSERTION
Anesthesia: Monitor Anesthesia Care | Site: Eye | Laterality: Right | Wound class: Clean

## 2012-10-04 MED ORDER — CYCLOPENTOLATE-PHENYLEPHRINE 0.2-1 % OP SOLN
1.0000 [drp] | OPHTHALMIC | Status: AC
  Administered 2012-10-04 (×3): 1 [drp] via OPHTHALMIC

## 2012-10-04 MED ORDER — LIDOCAINE HCL 3.5 % OP GEL
1.0000 "application " | Freq: Once | OPHTHALMIC | Status: AC
  Administered 2012-10-04: 1 via OPHTHALMIC

## 2012-10-04 MED ORDER — LIDOCAINE HCL (PF) 1 % IJ SOLN
INTRAMUSCULAR | Status: DC | PRN
  Administered 2012-10-04: .4 mL

## 2012-10-04 MED ORDER — PROVISC 10 MG/ML IO SOLN
INTRAOCULAR | Status: DC | PRN
  Administered 2012-10-04: 8.5 mg via INTRAOCULAR

## 2012-10-04 MED ORDER — EPINEPHRINE HCL 1 MG/ML IJ SOLN
INTRAOCULAR | Status: DC | PRN
  Administered 2012-10-04: 12:00:00

## 2012-10-04 MED ORDER — LIDOCAINE 3.5 % OP GEL OPTIME - NO CHARGE
OPHTHALMIC | Status: DC | PRN
  Administered 2012-10-04: 2 [drp] via OPHTHALMIC

## 2012-10-04 MED ORDER — MIDAZOLAM HCL 2 MG/2ML IJ SOLN
1.0000 mg | INTRAMUSCULAR | Status: DC | PRN
  Administered 2012-10-04: 2 mg via INTRAVENOUS

## 2012-10-04 MED ORDER — PHENYLEPHRINE HCL 2.5 % OP SOLN
1.0000 [drp] | OPHTHALMIC | Status: AC
  Administered 2012-10-04 (×3): 1 [drp] via OPHTHALMIC

## 2012-10-04 MED ORDER — TETRACAINE HCL 0.5 % OP SOLN
1.0000 [drp] | OPHTHALMIC | Status: AC
  Administered 2012-10-04 (×3): 1 [drp] via OPHTHALMIC

## 2012-10-04 MED ORDER — BSS IO SOLN
INTRAOCULAR | Status: DC | PRN
  Administered 2012-10-04: 15 mL via INTRAOCULAR

## 2012-10-04 MED ORDER — NEOMYCIN-POLYMYXIN-DEXAMETH 0.1 % OP OINT
TOPICAL_OINTMENT | OPHTHALMIC | Status: DC | PRN
  Administered 2012-10-04: 1 via OPHTHALMIC

## 2012-10-04 MED ORDER — MIDAZOLAM HCL 2 MG/2ML IJ SOLN
INTRAMUSCULAR | Status: AC
  Filled 2012-10-04: qty 2

## 2012-10-04 MED ORDER — LACTATED RINGERS IV SOLN
INTRAVENOUS | Status: DC
  Administered 2012-10-04: 1000 mL via INTRAVENOUS

## 2012-10-04 MED ORDER — POVIDONE-IODINE 5 % OP SOLN
OPHTHALMIC | Status: DC | PRN
  Administered 2012-10-04: 1

## 2012-10-04 MED ORDER — EPINEPHRINE HCL 1 MG/ML IJ SOLN
INTRAMUSCULAR | Status: AC
  Filled 2012-10-04: qty 1

## 2012-10-04 SURGICAL SUPPLY — 32 items
CAPSULAR TENSION RING-AMO (OPHTHALMIC RELATED) IMPLANT
CLOTH BEACON ORANGE TIMEOUT ST (SAFETY) ×2 IMPLANT
EYE SHIELD UNIVERSAL CLEAR (GAUZE/BANDAGES/DRESSINGS) ×2 IMPLANT
GLOVE BIO SURGEON STRL SZ 6.5 (GLOVE) IMPLANT
GLOVE BIOGEL PI IND STRL 6.5 (GLOVE) IMPLANT
GLOVE BIOGEL PI IND STRL 7.0 (GLOVE) IMPLANT
GLOVE BIOGEL PI IND STRL 7.5 (GLOVE) ×1 IMPLANT
GLOVE BIOGEL PI INDICATOR 6.5 (GLOVE)
GLOVE BIOGEL PI INDICATOR 7.0 (GLOVE)
GLOVE BIOGEL PI INDICATOR 7.5 (GLOVE) ×1
GLOVE ECLIPSE 6.5 STRL STRAW (GLOVE) IMPLANT
GLOVE ECLIPSE 7.0 STRL STRAW (GLOVE) IMPLANT
GLOVE ECLIPSE 7.5 STRL STRAW (GLOVE) IMPLANT
GLOVE EXAM NITRILE LRG STRL (GLOVE) IMPLANT
GLOVE EXAM NITRILE MD LF STRL (GLOVE) ×2 IMPLANT
GLOVE SKINSENSE NS SZ6.5 (GLOVE)
GLOVE SKINSENSE NS SZ7.0 (GLOVE)
GLOVE SKINSENSE STRL SZ6.5 (GLOVE) IMPLANT
GLOVE SKINSENSE STRL SZ7.0 (GLOVE) IMPLANT
KIT VITRECTOMY (OPHTHALMIC RELATED) IMPLANT
PAD ARMBOARD 7.5X6 YLW CONV (MISCELLANEOUS) ×2 IMPLANT
PROC W NO LENS (INTRAOCULAR LENS)
PROC W SPEC LENS (INTRAOCULAR LENS)
PROCESS W NO LENS (INTRAOCULAR LENS) IMPLANT
PROCESS W SPEC LENS (INTRAOCULAR LENS) IMPLANT
RING MALYGIN (MISCELLANEOUS) IMPLANT
SIGHTPATH CAT PROC W REG LENS (Ophthalmic Related) ×2 IMPLANT
SYR TB 1ML LL NO SAFETY (SYRINGE) ×2 IMPLANT
TAPE SURG TRANSPORE 1 IN (GAUZE/BANDAGES/DRESSINGS) ×1 IMPLANT
TAPE SURGICAL TRANSPORE 1 IN (GAUZE/BANDAGES/DRESSINGS) ×1
VISCOELASTIC ADDITIONAL (OPHTHALMIC RELATED) IMPLANT
WATER STERILE IRR 250ML POUR (IV SOLUTION) ×2 IMPLANT

## 2012-10-04 NOTE — H&P (Signed)
I have reviewed the H&P, the patient was re-examined, and I have identified no interval changes in medical condition and plan of care since the history and physical of record  

## 2012-10-04 NOTE — Transfer of Care (Signed)
Immediate Anesthesia Transfer of Care Note  Patient: Robin Preston  Procedure(s) Performed: Procedure(s) with comments: CATARACT EXTRACTION PHACO AND INTRAOCULAR LENS PLACEMENT (IOC) (Right) - CDE:  15.78  Patient Location: Short Stay  Anesthesia Type:MAC  Level of Consciousness: awake, alert , oriented and patient cooperative  Airway & Oxygen Therapy: Patient Spontanous Breathing  Post-op Assessment: Report given to PACU RN, Post -op Vital signs reviewed and stable and Patient moving all extremities  Post vital signs: Reviewed and stable  Complications: No apparent anesthesia complications

## 2012-10-04 NOTE — Anesthesia Preprocedure Evaluation (Addendum)
Anesthesia Evaluation  Patient identified by MRN, date of birth, ID band Patient awake    Reviewed: Allergy & Precautions, H&P , NPO status , Patient's Chart, lab work & pertinent test results  Airway Mallampati: II      Dental  (+) Partial Upper   Pulmonary shortness of breath,  breath sounds clear to auscultation        Cardiovascular + dysrhythmias (bradycardia) Rhythm:Regular Rate:Bradycardia     Neuro/Psych    GI/Hepatic negative GI ROS,   Endo/Other    Renal/GU      Musculoskeletal   Abdominal   Peds  Hematology   Anesthesia Other Findings   Reproductive/Obstetrics                           Anesthesia Physical Anesthesia Plan  ASA: II  Anesthesia Plan: MAC   Post-op Pain Management:    Induction: Intravenous  Airway Management Planned: Nasal Cannula  Additional Equipment:   Intra-op Plan:   Post-operative Plan:   Informed Consent: I have reviewed the patients History and Physical, chart, labs and discussed the procedure including the risks, benefits and alternatives for the proposed anesthesia with the patient or authorized representative who has indicated his/her understanding and acceptance.     Plan Discussed with:   Anesthesia Plan Comments:         Anesthesia Quick Evaluation

## 2012-10-04 NOTE — Op Note (Signed)
Date of Admission: 10/04/12  Date of Surgery: 10/04/12  Pre-Op Dx: Cataract  Right  Eye  Post-Op Dx: Combined Cataract  Right Eye, Dx Code 366.19  Surgeon: Tino Ronan, M.D.  Assistants: None  Anesthesia: Topical with MAC  Indications: Painless, progressive loss of vision with compromise of daily activities.  Surgery: Cataract Extraction with Intraocular lens Implant Right Eye  Discription: The patient had dilating drops and viscous lidocaine placed into the left eye in the pre-op holding area. After transfer to the operating room, a time out was performed. The patient was then prepped and draped. Beginning with a 75 degree blade a paracentesis port was made at the surgeon's 2 o'clock position. The anterior chamber was then filled with 2% non-preserved lidocaine. This was followed by filling the anterior chamber with Provisc. A bent cystatome needle was used to create a continuous tear capsulotomy. Hydrodissection was performed with balanced salt solution on a Fine canula. The lens nucleus was then removed using the phacoemulsification handpiece. Residual cortex was removed with the I&A handpiece. The anterior chamber and capsular bag were refilled with Provisc. A posterior chamber intraocular lens was placed into the capsular bag with it's injector. The implant was positioned with the Kuglan hook. The Provisc was then removed from the anterior chamber and capsular bag with the I&A handpiece. Stromal hydration of the main incision and paracentesis port was performed with BSS on a Fine canula. The wounds were tested for leak which was negative. The patient tolerated the procedure well. There were no operative complications. The patient was then transferred to the recovery room in stable condition.  Prosthetic device: B&L enVista, MX60, power 21.0D.  Specimen: None  EBL: None  Complications: None 

## 2012-10-04 NOTE — Anesthesia Postprocedure Evaluation (Signed)
  Anesthesia Post-op Note  Patient: Robin Preston  Procedure(s) Performed: Procedure(s) with comments: CATARACT EXTRACTION PHACO AND INTRAOCULAR LENS PLACEMENT (IOC) (Right) - CDE:  15.78  Patient Location: Short Stay  Anesthesia Type:MAC  Level of Consciousness: awake, alert , oriented and patient cooperative  Airway and Oxygen Therapy: Patient Spontanous Breathing  Post-op Pain: none  Post-op Assessment: Post-op Vital signs reviewed, Patient's Cardiovascular Status Stable, Respiratory Function Stable, Patent Airway, No signs of Nausea or vomiting and Pain level controlled  Post-op Vital Signs: Reviewed and stable  Complications: No apparent anesthesia complications

## 2012-10-05 ENCOUNTER — Encounter (HOSPITAL_COMMUNITY): Payer: Self-pay | Admitting: Ophthalmology

## 2013-04-13 DIAGNOSIS — Z23 Encounter for immunization: Secondary | ICD-10-CM | POA: Diagnosis not present

## 2013-04-18 DIAGNOSIS — N390 Urinary tract infection, site not specified: Secondary | ICD-10-CM | POA: Diagnosis not present

## 2013-04-18 DIAGNOSIS — Z681 Body mass index (BMI) 19 or less, adult: Secondary | ICD-10-CM | POA: Diagnosis not present

## 2013-04-18 DIAGNOSIS — M159 Polyosteoarthritis, unspecified: Secondary | ICD-10-CM | POA: Diagnosis not present

## 2013-05-13 DIAGNOSIS — S61409A Unspecified open wound of unspecified hand, initial encounter: Secondary | ICD-10-CM | POA: Diagnosis not present

## 2013-05-13 DIAGNOSIS — Z681 Body mass index (BMI) 19 or less, adult: Secondary | ICD-10-CM | POA: Diagnosis not present

## 2013-09-17 DIAGNOSIS — IMO0002 Reserved for concepts with insufficient information to code with codable children: Secondary | ICD-10-CM | POA: Diagnosis not present

## 2013-09-17 DIAGNOSIS — J329 Chronic sinusitis, unspecified: Secondary | ICD-10-CM | POA: Diagnosis not present

## 2013-09-17 DIAGNOSIS — L03818 Cellulitis of other sites: Secondary | ICD-10-CM | POA: Diagnosis not present

## 2013-09-17 DIAGNOSIS — L02818 Cutaneous abscess of other sites: Secondary | ICD-10-CM | POA: Diagnosis not present

## 2013-10-04 DIAGNOSIS — J069 Acute upper respiratory infection, unspecified: Secondary | ICD-10-CM | POA: Diagnosis not present

## 2013-10-04 DIAGNOSIS — J392 Other diseases of pharynx: Secondary | ICD-10-CM | POA: Diagnosis not present

## 2013-10-04 DIAGNOSIS — J210 Acute bronchiolitis due to respiratory syncytial virus: Secondary | ICD-10-CM | POA: Diagnosis not present

## 2013-10-13 DIAGNOSIS — R05 Cough: Secondary | ICD-10-CM | POA: Diagnosis not present

## 2013-10-13 DIAGNOSIS — R059 Cough, unspecified: Secondary | ICD-10-CM | POA: Diagnosis not present

## 2013-10-13 DIAGNOSIS — J310 Chronic rhinitis: Secondary | ICD-10-CM | POA: Diagnosis not present

## 2013-10-13 DIAGNOSIS — H9319 Tinnitus, unspecified ear: Secondary | ICD-10-CM | POA: Diagnosis not present

## 2013-10-13 DIAGNOSIS — H905 Unspecified sensorineural hearing loss: Secondary | ICD-10-CM | POA: Diagnosis not present

## 2013-10-19 ENCOUNTER — Other Ambulatory Visit (HOSPITAL_COMMUNITY): Payer: Self-pay | Admitting: Family Medicine

## 2013-10-19 DIAGNOSIS — Z79890 Hormone replacement therapy (postmenopausal): Secondary | ICD-10-CM | POA: Diagnosis not present

## 2013-10-19 DIAGNOSIS — Z681 Body mass index (BMI) 19 or less, adult: Secondary | ICD-10-CM | POA: Diagnosis not present

## 2013-10-19 DIAGNOSIS — Z139 Encounter for screening, unspecified: Secondary | ICD-10-CM

## 2013-10-19 DIAGNOSIS — Z0000 Encounter for general adult medical examination without abnormal findings: Secondary | ICD-10-CM | POA: Diagnosis not present

## 2013-10-19 DIAGNOSIS — Z13220 Encounter for screening for lipoid disorders: Secondary | ICD-10-CM | POA: Diagnosis not present

## 2013-10-19 DIAGNOSIS — Z23 Encounter for immunization: Secondary | ICD-10-CM | POA: Diagnosis not present

## 2013-10-19 DIAGNOSIS — L989 Disorder of the skin and subcutaneous tissue, unspecified: Secondary | ICD-10-CM | POA: Diagnosis not present

## 2013-10-19 DIAGNOSIS — R300 Dysuria: Secondary | ICD-10-CM | POA: Diagnosis not present

## 2013-10-19 DIAGNOSIS — Z79899 Other long term (current) drug therapy: Secondary | ICD-10-CM | POA: Diagnosis not present

## 2013-10-24 ENCOUNTER — Other Ambulatory Visit (HOSPITAL_COMMUNITY): Payer: Self-pay | Admitting: Family Medicine

## 2013-10-24 ENCOUNTER — Ambulatory Visit (HOSPITAL_COMMUNITY)
Admission: RE | Admit: 2013-10-24 | Discharge: 2013-10-24 | Disposition: A | Discharge status: Home / Self Care | Payer: Medicare Other | Source: Ambulatory Visit | Attending: Family Medicine | Admitting: Family Medicine

## 2013-10-24 DIAGNOSIS — Z1231 Encounter for screening mammogram for malignant neoplasm of breast: Secondary | ICD-10-CM | POA: Diagnosis not present

## 2013-10-24 DIAGNOSIS — R928 Other abnormal and inconclusive findings on diagnostic imaging of breast: Secondary | ICD-10-CM | POA: Insufficient documentation

## 2013-10-25 ENCOUNTER — Other Ambulatory Visit: Payer: Self-pay | Admitting: Family Medicine

## 2013-10-25 DIAGNOSIS — R928 Other abnormal and inconclusive findings on diagnostic imaging of breast: Secondary | ICD-10-CM

## 2013-11-16 ENCOUNTER — Ambulatory Visit (HOSPITAL_COMMUNITY)
Admission: RE | Admit: 2013-11-16 | Discharge: 2013-11-16 | Disposition: A | Discharge status: Home / Self Care | Payer: Medicare Other | Source: Ambulatory Visit | Attending: Family Medicine | Admitting: Family Medicine

## 2013-11-16 DIAGNOSIS — R922 Inconclusive mammogram: Secondary | ICD-10-CM | POA: Diagnosis not present

## 2013-11-16 DIAGNOSIS — R928 Other abnormal and inconclusive findings on diagnostic imaging of breast: Secondary | ICD-10-CM | POA: Insufficient documentation

## 2013-11-28 DIAGNOSIS — N390 Urinary tract infection, site not specified: Secondary | ICD-10-CM | POA: Diagnosis not present

## 2013-11-28 DIAGNOSIS — Z681 Body mass index (BMI) 19 or less, adult: Secondary | ICD-10-CM | POA: Diagnosis not present

## 2013-12-05 DIAGNOSIS — L980 Pyogenic granuloma: Secondary | ICD-10-CM | POA: Diagnosis not present

## 2013-12-05 DIAGNOSIS — L821 Other seborrheic keratosis: Secondary | ICD-10-CM | POA: Diagnosis not present

## 2013-12-05 DIAGNOSIS — D239 Other benign neoplasm of skin, unspecified: Secondary | ICD-10-CM | POA: Diagnosis not present

## 2013-12-21 DIAGNOSIS — Z681 Body mass index (BMI) 19 or less, adult: Secondary | ICD-10-CM | POA: Diagnosis not present

## 2013-12-21 DIAGNOSIS — N390 Urinary tract infection, site not specified: Secondary | ICD-10-CM | POA: Diagnosis not present

## 2013-12-28 DIAGNOSIS — S91109A Unspecified open wound of unspecified toe(s) without damage to nail, initial encounter: Secondary | ICD-10-CM | POA: Diagnosis not present

## 2014-01-16 DIAGNOSIS — M5030 Other cervical disc degeneration, unspecified cervical region: Secondary | ICD-10-CM | POA: Diagnosis not present

## 2014-01-16 DIAGNOSIS — Z681 Body mass index (BMI) 19 or less, adult: Secondary | ICD-10-CM | POA: Diagnosis not present

## 2014-01-19 DIAGNOSIS — Z681 Body mass index (BMI) 19 or less, adult: Secondary | ICD-10-CM | POA: Diagnosis not present

## 2014-01-19 DIAGNOSIS — N390 Urinary tract infection, site not specified: Secondary | ICD-10-CM | POA: Diagnosis not present

## 2014-01-19 DIAGNOSIS — T6391XA Toxic effect of contact with unspecified venomous animal, accidental (unintentional), initial encounter: Secondary | ICD-10-CM | POA: Diagnosis not present

## 2014-02-28 DIAGNOSIS — Z681 Body mass index (BMI) 19 or less, adult: Secondary | ICD-10-CM | POA: Diagnosis not present

## 2014-02-28 DIAGNOSIS — L989 Disorder of the skin and subcutaneous tissue, unspecified: Secondary | ICD-10-CM | POA: Diagnosis not present

## 2014-02-28 DIAGNOSIS — N390 Urinary tract infection, site not specified: Secondary | ICD-10-CM | POA: Diagnosis not present

## 2014-03-13 DIAGNOSIS — N390 Urinary tract infection, site not specified: Secondary | ICD-10-CM | POA: Diagnosis not present

## 2014-03-21 DIAGNOSIS — M25549 Pain in joints of unspecified hand: Secondary | ICD-10-CM | POA: Diagnosis not present

## 2014-03-21 DIAGNOSIS — M25519 Pain in unspecified shoulder: Secondary | ICD-10-CM | POA: Diagnosis not present

## 2014-03-21 DIAGNOSIS — M2550 Pain in unspecified joint: Secondary | ICD-10-CM | POA: Diagnosis not present

## 2014-03-21 DIAGNOSIS — Z79899 Other long term (current) drug therapy: Secondary | ICD-10-CM | POA: Diagnosis not present

## 2014-03-21 DIAGNOSIS — R5381 Other malaise: Secondary | ICD-10-CM | POA: Diagnosis not present

## 2014-03-21 DIAGNOSIS — R5383 Other fatigue: Secondary | ICD-10-CM | POA: Diagnosis not present

## 2014-03-21 DIAGNOSIS — M542 Cervicalgia: Secondary | ICD-10-CM | POA: Diagnosis not present

## 2014-03-27 ENCOUNTER — Ambulatory Visit (HOSPITAL_COMMUNITY)
Admission: RE | Admit: 2014-03-27 | Discharge: 2014-03-27 | Disposition: A | Discharge status: Home / Self Care | Payer: Medicare Other | Source: Ambulatory Visit | Attending: Rheumatology | Admitting: Rheumatology

## 2014-03-27 ENCOUNTER — Other Ambulatory Visit (HOSPITAL_COMMUNITY): Payer: Self-pay | Admitting: Rheumatology

## 2014-03-27 DIAGNOSIS — Z139 Encounter for screening, unspecified: Secondary | ICD-10-CM

## 2014-03-27 DIAGNOSIS — Z9225 Personal history of immunosupression therapy: Secondary | ICD-10-CM | POA: Diagnosis not present

## 2014-03-27 DIAGNOSIS — M2550 Pain in unspecified joint: Secondary | ICD-10-CM | POA: Diagnosis not present

## 2014-03-27 DIAGNOSIS — Z1389 Encounter for screening for other disorder: Secondary | ICD-10-CM | POA: Diagnosis not present

## 2014-03-27 DIAGNOSIS — R5381 Other malaise: Secondary | ICD-10-CM | POA: Diagnosis not present

## 2014-03-27 DIAGNOSIS — Z113 Encounter for screening for infections with a predominantly sexual mode of transmission: Secondary | ICD-10-CM | POA: Diagnosis not present

## 2014-03-31 DIAGNOSIS — Z681 Body mass index (BMI) 19 or less, adult: Secondary | ICD-10-CM | POA: Diagnosis not present

## 2014-03-31 DIAGNOSIS — L309 Dermatitis, unspecified: Secondary | ICD-10-CM | POA: Diagnosis not present

## 2014-04-06 DIAGNOSIS — L309 Dermatitis, unspecified: Secondary | ICD-10-CM | POA: Diagnosis not present

## 2014-04-06 DIAGNOSIS — D485 Neoplasm of uncertain behavior of skin: Secondary | ICD-10-CM | POA: Diagnosis not present

## 2014-04-12 DIAGNOSIS — M79641 Pain in right hand: Secondary | ICD-10-CM | POA: Diagnosis not present

## 2014-04-12 DIAGNOSIS — M79642 Pain in left hand: Secondary | ICD-10-CM | POA: Diagnosis not present

## 2014-04-15 DIAGNOSIS — Z23 Encounter for immunization: Secondary | ICD-10-CM | POA: Diagnosis not present

## 2014-05-01 DIAGNOSIS — L409 Psoriasis, unspecified: Secondary | ICD-10-CM | POA: Diagnosis not present

## 2014-05-01 DIAGNOSIS — Z79899 Other long term (current) drug therapy: Secondary | ICD-10-CM | POA: Diagnosis not present

## 2014-05-01 DIAGNOSIS — L500 Allergic urticaria: Secondary | ICD-10-CM | POA: Diagnosis not present

## 2014-05-02 ENCOUNTER — Encounter (HOSPITAL_BASED_OUTPATIENT_CLINIC_OR_DEPARTMENT_OTHER)
Admission: RE | Disposition: A | Discharge status: Home / Self Care | Payer: Self-pay | Source: Ambulatory Visit | Attending: Orthopedic Surgery

## 2014-05-02 ENCOUNTER — Ambulatory Visit (HOSPITAL_BASED_OUTPATIENT_CLINIC_OR_DEPARTMENT_OTHER)
Admission: RE | Admit: 2014-05-02 | Discharge: 2014-05-02 | Disposition: A | Discharge status: Home / Self Care | Payer: Medicare Other | Source: Ambulatory Visit | Attending: Orthopedic Surgery | Admitting: Orthopedic Surgery

## 2014-05-02 ENCOUNTER — Encounter (HOSPITAL_BASED_OUTPATIENT_CLINIC_OR_DEPARTMENT_OTHER): Payer: Self-pay | Admitting: *Deleted

## 2014-05-02 DIAGNOSIS — M1990 Unspecified osteoarthritis, unspecified site: Secondary | ICD-10-CM | POA: Diagnosis not present

## 2014-05-02 DIAGNOSIS — R001 Bradycardia, unspecified: Secondary | ICD-10-CM | POA: Diagnosis not present

## 2014-05-02 DIAGNOSIS — Z85828 Personal history of other malignant neoplasm of skin: Secondary | ICD-10-CM | POA: Insufficient documentation

## 2014-05-02 DIAGNOSIS — L03012 Cellulitis of left finger: Secondary | ICD-10-CM | POA: Insufficient documentation

## 2014-05-02 HISTORY — PX: IRRIGATION AND DEBRIDEMENT ABSCESS: SHX5252

## 2014-05-02 SURGERY — MINOR INCISION AND DRAINAGE OF ABSCESS
Anesthesia: LOCAL | Site: Finger | Laterality: Left

## 2014-05-02 MED ORDER — LIDOCAINE HCL 1 % IJ SOLN
INTRAMUSCULAR | Status: DC | PRN
  Administered 2014-05-02: 10 mL via INTRAMUSCULAR

## 2014-05-02 MED ORDER — 0.9 % SODIUM CHLORIDE (POUR BTL) OPTIME
TOPICAL | Status: DC | PRN
  Administered 2014-05-02: 200 mL

## 2014-05-02 MED ORDER — HYDROCODONE-ACETAMINOPHEN 5-325 MG PO TABS: ORAL_TABLET | ORAL | Status: DC

## 2014-05-02 MED ORDER — DOXYCYCLINE HYCLATE 50 MG PO CAPS: 100.0000 mg | ORAL_CAPSULE | Freq: Two times a day (BID) | ORAL | Status: DC

## 2014-05-02 SURGICAL SUPPLY — 43 items
BANDAGE COBAN STERILE 2 (GAUZE/BANDAGES/DRESSINGS) IMPLANT
BLADE SURG 15 STRL LF DISP TIS (BLADE) ×1 IMPLANT
BLADE SURG 15 STRL SS (BLADE) ×1
BNDG COHESIVE 1X5 TAN STRL LF (GAUZE/BANDAGES/DRESSINGS) ×2 IMPLANT
BNDG CONFORM 2 STRL LF (GAUZE/BANDAGES/DRESSINGS) IMPLANT
BNDG ELASTIC 2 VLCR STRL LF (GAUZE/BANDAGES/DRESSINGS) IMPLANT
BNDG ESMARK 4X9 LF (GAUZE/BANDAGES/DRESSINGS) IMPLANT
CHLORAPREP W/TINT 26ML (MISCELLANEOUS) ×2 IMPLANT
CORDS BIPOLAR (ELECTRODE) IMPLANT
COVER BACK TABLE 60X90IN (DRAPES) ×2 IMPLANT
COVER MAYO STAND STRL (DRAPES) ×2 IMPLANT
CUFF TOURNIQUET SINGLE 18IN (TOURNIQUET CUFF) IMPLANT
DRAIN PENROSE 1/2X12 LTX STRL (WOUND CARE) ×2 IMPLANT
DRAIN PENROSE 1/4X12 LTX STRL (WOUND CARE) ×2 IMPLANT
DRAPE EXTREMITY T 121X128X90 (DRAPE) ×2 IMPLANT
DRAPE SURG 17X23 STRL (DRAPES) ×2 IMPLANT
GAUZE PACKING IODOFORM 1/4X15 (GAUZE/BANDAGES/DRESSINGS) ×2 IMPLANT
GAUZE SPONGE 4X4 12PLY STRL (GAUZE/BANDAGES/DRESSINGS) ×2 IMPLANT
GAUZE XEROFORM 1X8 LF (GAUZE/BANDAGES/DRESSINGS) ×2 IMPLANT
GLOVE BIO SURGEON STRL SZ7.5 (GLOVE) ×2 IMPLANT
GLOVE BIOGEL PI IND STRL 7.0 (GLOVE) ×1 IMPLANT
GLOVE BIOGEL PI IND STRL 8 (GLOVE) ×1 IMPLANT
GLOVE BIOGEL PI INDICATOR 7.0 (GLOVE) ×1
GLOVE BIOGEL PI INDICATOR 8 (GLOVE) ×1
GLOVE ECLIPSE 6.5 STRL STRAW (GLOVE) ×2 IMPLANT
GLOVE EXAM NITRILE LRG STRL (GLOVE) ×2 IMPLANT
GOWN STRL REUS W/ TWL LRG LVL3 (GOWN DISPOSABLE) ×1 IMPLANT
GOWN STRL REUS W/TWL LRG LVL3 (GOWN DISPOSABLE) ×1
GOWN STRL REUS W/TWL XL LVL3 (GOWN DISPOSABLE) ×2 IMPLANT
NEEDLE HYPO 25X1 1.5 SAFETY (NEEDLE) ×2 IMPLANT
NS IRRIG 1000ML POUR BTL (IV SOLUTION) ×2 IMPLANT
PACK BASIN DAY SURGERY FS (CUSTOM PROCEDURE TRAY) IMPLANT
PADDING CAST ABS 4INX4YD NS (CAST SUPPLIES)
PADDING CAST ABS COTTON 4X4 ST (CAST SUPPLIES) IMPLANT
SPLINT FINGER 3.25 BULB 911905 (SOFTGOODS) ×2 IMPLANT
STOCKINETTE 4X48 STRL (DRAPES) ×2 IMPLANT
SUT ETHILON 4 0 PS 2 18 (SUTURE) IMPLANT
SWAB COLLECTION DEVICE MRSA (MISCELLANEOUS) ×2 IMPLANT
SYR BULB 3OZ (MISCELLANEOUS) ×2 IMPLANT
SYR CONTROL 10ML LL (SYRINGE) ×2 IMPLANT
TOWEL OR 17X24 6PK STRL BLUE (TOWEL DISPOSABLE) ×2 IMPLANT
TUBE ANAEROBIC SPECIMEN COL (MISCELLANEOUS) ×2 IMPLANT
UNDERPAD 30X30 INCONTINENT (UNDERPADS AND DIAPERS) ×2 IMPLANT

## 2014-05-02 NOTE — Brief Op Note (Signed)
05/02/2014  4:06 PM  PATIENT:  Robin Preston  68 y.o. female  PRE-OPERATIVE DIAGNOSIS:  INFECTED LEFT RING FINGER   POST-OPERATIVE DIAGNOSIS:  Left ring finger paronychia  PROCEDURE:  Procedure(s) with comments: MINOR INCISION AND DRAINAGE OF LEFT RING FINGER ABSCESS (Left) - I & D LEFT RING FINGER  SURGEON:  Surgeon(s) and Role:    * Leanora Cover, MD - Primary  PHYSICIAN ASSISTANT:   ASSISTANTS: none   ANESTHESIA:   digital block: 50/50 1% lidocaine/0.25% marcaine plain  EBL:     BLOOD ADMINISTERED:none  DRAINS: iodoform packing  LOCAL MEDICATIONS USED:  MARCAINE    and LIDOCAINE   SPECIMEN:  Source of Specimen:  left ring finger  DISPOSITION OF SPECIMEN:  micro  COUNTS:  YES  TOURNIQUET:   Total Tourniquet Time Documented: area (Left) - 7 minutes Total: area (Left) - 7 minutes   DICTATION: .Other Dictation: Dictation Number [id number]  PLAN OF CARE: Discharge to home after PACU  PATIENT DISPOSITION:  PACU - hemodynamically stable.

## 2014-05-02 NOTE — Discharge Instructions (Signed)

## 2014-05-02 NOTE — H&P (Signed)
  Robin Preston is an 68 y.o. female.   Chief Complaint: left ring finger paronychia HPI: 68 yo female with ~6 days swelling and erythema of left ring finger.  No known injury.  No fevers, chills, night sweats.  Has been treating the area topically.  Seeing rheumatology regarding arthritis and has been told it may be psoriatic arthritis.  Past Medical History  Diagnosis Date  . Thyroid disease   . Dyspnea   . Dizziness 12/2011    Onset in 12/2011  . Sinus bradycardia   . Degenerative joint disease     Hands; Rx -parenteral steroids    Past Surgical History  Procedure Laterality Date  . Abdominal hysterectomy    . Colonoscopy w/ polypectomy  01/2010    internal hemorrhoids;9 mm rectal tubular adenoma  . Skin cancer excision      Nose  . 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 Cataract extraction w/phaco Right 10/04/2012    Procedure: CATARACT EXTRACTION PHACO AND INTRAOCULAR LENS PLACEMENT (IOC);  Surgeon: Tonny Branch, MD;  Location: [facility];  Service: Ophthalmology;  Laterality: Right;  CDE:  15.78    No family history on file. Social History:  reports that she has never smoked. She does not have any smokeless tobacco history on file. She reports that she does not drink alcohol or use illicit drugs.  Allergies:  Allergies  Allergen Reactions  . Sulfa Antibiotics Rash    Medications Prior to Admission  Medication Sig Dispense Refill  . estrogens, conjugated, (PREMARIN) 0.625 MG tablet Take 0.625 mg by mouth every other day.     Marland Kitchen HYDROcodone-acetaminophen (NORCO/VICODIN) 5-325 MG per tablet Take 1 tablet by mouth every 6 (six) hours as needed. For pain    . ibuprofen (ADVIL,MOTRIN) 200 MG tablet Take 200 mg by mouth every 6 (six) hours as needed for pain.    . methimazole (TAPAZOLE) 10 MG tablet Take 10 mg by mouth every morning.       No results found for this or any previous visit (from the past 48 hour(s)).  No results found.   A comprehensive review of  systems was negative.  There were no vitals taken for this visit.  General appearance: alert, cooperative and appears stated age Head: Normocephalic, without obvious abnormality, atraumatic Neck: supple, symmetrical, trachea midline Resp: clear to auscultation bilaterally Cardio: regular rate and rhythm GI: non tender Extremities: intact sensation and capillary refill all digits.  +epl/fpl/io.  degenerative changes at dip joints of all digits.  left ring with granulation tissue at radial side of nail. mildly swollen.  no active drainage.  no proximal streaking. Pulses: 2+ and symmetric Skin: Skin color, texture, turgor normal. No rashes or lesions Neurologic: Grossly normal Incision/Wound: As above  Assessment/Plan Left ring finger paronychia with granulation tissue.  Recommend OR for debridement under digital block.  Risks, benefits, and alternatives of surgery were discussed and the patient agrees with the plan of care.   Turki Tapanes R 05/02/2014, 3:26 PM

## 2014-05-02 NOTE — Op Note (Signed)
[id number] 

## 2014-05-03 ENCOUNTER — Encounter (HOSPITAL_BASED_OUTPATIENT_CLINIC_OR_DEPARTMENT_OTHER): Payer: Self-pay | Admitting: Orthopedic Surgery

## 2014-05-03 NOTE — Op Note (Signed)
Robin Preston, Robin Preston                  ACCOUNT NO.:  [id number]  MEDICAL RECORD NO.:  [id number]  LOCATION:                                 FACILITY:  PHYSICIAN:  Leanora Cover, MD        DATE OF BIRTH:  Oct 10, 1945  DATE OF PROCEDURE:  05/02/2014 DATE OF DISCHARGE:                              OPERATIVE REPORT   PREOPERATIVE DIAGNOSIS:  Left ring finger paronychia.  POSTOPERATIVE DIAGNOSIS:  Left ring finger paronychia.  PROCEDURE:  Incision and drainage, left ring finger paronychia.  SURGEON:  Leanora Cover, MD  ASSISTANT:  None.  ANESTHESIA:  Digital block with half and half solution of 1% plain lidocaine and 0.25% plain Marcaine.  SPECIMENS:  Cultures to Micro.  TOURNIQUET TIME:  Penrose drain 7 minutes.  DISPOSITION:  Stable.  INDICATIONS:  Robin Preston is a 68 year old female who has noted 6 days of issues with her left ring finger.  She has had erythema and swelling at the radial side of the nail with granulation tissue.  She has been treating this topically.  She called today for further care. Recommended incision and drainage in the operating room.  Risks, benefits, and alternatives of surgery were discussed including risk of blood loss, infection, damage to nerves, vessels, tendons, ligaments, bone; failure of surgery; need for additional surgery, complications with wound healing, continued pain, continued infection, need for repeat irrigation and debridement.  She voiced understanding of these risks and elected to proceed.  OPERATIVE COURSE:  After being identified preoperatively by myself, the patient and I agreed upon procedure and site of procedure.  Surgical site was marked.  Risks, benefits, and alternatives of surgery were reviewed and she wished to proceed.  Surgical consent had been signed. She was transported to the operating room, placed on the operating table in a supine position with the left upper extremity on arm board.  A surgical pause was performed  between surgeons, operating staff, and patient; all were in agreement as to the patient, procedure, and site of procedure.  A digital block was performed with 10 mL of half and half solution 1% plain lidocaine and 0.25% plain Marcaine.  This was adequate to get total digital anesthesia of the left ring finger.  The arm was prepped and sterilely draped.  A surgical pause was again performed. The Penrose drain was used as a tourniquet, was up for 7 minutes.  The nail was removed with a Soil scientist.  There was no gross purulence from under the nail.  At the radial side of the edge of the nail, there was granulation tissue.  This was spread with scissors.  There was no gross purulence.  Cultures were taken for aerobes and anaerobes.  The wound was copiously irrigated with sterile saline.  It was packed with quarter-inch iodoform gauze.  A piece of Xeroform was placed in the nail fold and the nail bed dressed with sterile Xeroform.  The finger was then dressed with a 4 x 4 and wrapped lightly with Coban dressing.  An Alumafoam splint was placed and wrapped with Coban dressing.  Penrose drain was removed.  The patient tolerated procedure well.  Operative drapes were broken down.  She was transferred back to recovery.  I will see her back in 3 days for postoperative followup.  I will give her Norco 5/325 one to two p.o. q.6 hours p.r.n. pain, dispensed #30 and doxycycline 100 mg p.o. b.i.d. x7 days.     Leanora Cover, MD     KK/MEDQ  D:  05/02/2014  T:  05/02/2014  Job:  [id number]

## 2014-05-05 DIAGNOSIS — L03012 Cellulitis of left finger: Secondary | ICD-10-CM | POA: Diagnosis not present

## 2014-05-05 DIAGNOSIS — L408 Other psoriasis: Secondary | ICD-10-CM | POA: Diagnosis not present

## 2014-05-05 DIAGNOSIS — M79641 Pain in right hand: Secondary | ICD-10-CM | POA: Diagnosis not present

## 2014-05-05 DIAGNOSIS — M542 Cervicalgia: Secondary | ICD-10-CM | POA: Diagnosis not present

## 2014-05-05 DIAGNOSIS — L4051 Distal interphalangeal psoriatic arthropathy: Secondary | ICD-10-CM | POA: Diagnosis not present

## 2014-05-05 DIAGNOSIS — L4050 Arthropathic psoriasis, unspecified: Secondary | ICD-10-CM | POA: Diagnosis not present

## 2014-05-05 LAB — CULTURE, ROUTINE-ABSCESS

## 2014-05-07 LAB — ANAEROBIC CULTURE

## 2014-05-25 ENCOUNTER — Other Ambulatory Visit: Payer: Self-pay | Admitting: Orthopedic Surgery

## 2014-06-01 ENCOUNTER — Ambulatory Visit (HOSPITAL_BASED_OUTPATIENT_CLINIC_OR_DEPARTMENT_OTHER)
Admission: RE | Admit: 2014-06-01 | Discharge: 2014-06-01 | Disposition: A | Discharge status: Home / Self Care | Payer: Medicare Other | Source: Ambulatory Visit | Attending: Orthopedic Surgery | Admitting: Orthopedic Surgery

## 2014-06-05 ENCOUNTER — Encounter (HOSPITAL_BASED_OUTPATIENT_CLINIC_OR_DEPARTMENT_OTHER): Payer: Self-pay | Admitting: *Deleted

## 2014-06-06 ENCOUNTER — Encounter (HOSPITAL_BASED_OUTPATIENT_CLINIC_OR_DEPARTMENT_OTHER)
Admission: RE | Disposition: A | Discharge status: Home / Self Care | Payer: Self-pay | Source: Ambulatory Visit | Attending: Orthopedic Surgery

## 2014-06-06 ENCOUNTER — Ambulatory Visit (HOSPITAL_BASED_OUTPATIENT_CLINIC_OR_DEPARTMENT_OTHER)
Admission: RE | Admit: 2014-06-06 | Discharge: 2014-06-06 | Disposition: A | Discharge status: Home / Self Care | Payer: Medicare Other | Source: Ambulatory Visit | Attending: Orthopedic Surgery | Admitting: Orthopedic Surgery

## 2014-06-06 ENCOUNTER — Encounter (HOSPITAL_BASED_OUTPATIENT_CLINIC_OR_DEPARTMENT_OTHER): Payer: Self-pay

## 2014-06-06 ENCOUNTER — Ambulatory Visit (HOSPITAL_BASED_OUTPATIENT_CLINIC_OR_DEPARTMENT_OTHER): Payer: Medicare Other | Admitting: Anesthesiology

## 2014-06-06 DIAGNOSIS — Z882 Allergy status to sulfonamides status: Secondary | ICD-10-CM | POA: Insufficient documentation

## 2014-06-06 DIAGNOSIS — M19041 Primary osteoarthritis, right hand: Secondary | ICD-10-CM | POA: Diagnosis not present

## 2014-06-06 DIAGNOSIS — E079 Disorder of thyroid, unspecified: Secondary | ICD-10-CM | POA: Diagnosis not present

## 2014-06-06 DIAGNOSIS — M19042 Primary osteoarthritis, left hand: Secondary | ICD-10-CM | POA: Insufficient documentation

## 2014-06-06 DIAGNOSIS — R0600 Dyspnea, unspecified: Secondary | ICD-10-CM | POA: Diagnosis not present

## 2014-06-06 DIAGNOSIS — R0602 Shortness of breath: Secondary | ICD-10-CM | POA: Insufficient documentation

## 2014-06-06 DIAGNOSIS — M1811 Unilateral primary osteoarthritis of first carpometacarpal joint, right hand: Secondary | ICD-10-CM | POA: Insufficient documentation

## 2014-06-06 DIAGNOSIS — M79641 Pain in right hand: Secondary | ICD-10-CM | POA: Diagnosis not present

## 2014-06-06 DIAGNOSIS — R42 Dizziness and giddiness: Secondary | ICD-10-CM | POA: Diagnosis not present

## 2014-06-06 DIAGNOSIS — G8918 Other acute postprocedural pain: Secondary | ICD-10-CM | POA: Diagnosis not present

## 2014-06-06 DIAGNOSIS — R001 Bradycardia, unspecified: Secondary | ICD-10-CM | POA: Diagnosis not present

## 2014-06-06 HISTORY — PX: FINGER ARTHRODESIS: SHX5000

## 2014-06-06 LAB — POCT HEMOGLOBIN-HEMACUE: Hemoglobin: 12.3 g/dL (ref 12.0–15.0)

## 2014-06-06 SURGERY — FUSION, JOINT, FINGER
Anesthesia: Monitor Anesthesia Care | Site: Thumb | Laterality: Right

## 2014-06-06 MED ORDER — PROPOFOL 10 MG/ML IV BOLUS
INTRAVENOUS | Status: DC | PRN
  Administered 2014-06-06: 30 mg via INTRAVENOUS

## 2014-06-06 MED ORDER — PROPOFOL INFUSION 10 MG/ML OPTIME
INTRAVENOUS | Status: DC | PRN
  Administered 2014-06-06: 100 ug/kg/min via INTRAVENOUS

## 2014-06-06 MED ORDER — DOXYCYCLINE HYCLATE 50 MG PO CAPS: 100.0000 mg | ORAL_CAPSULE | Freq: Two times a day (BID) | ORAL | Status: DC

## 2014-06-06 MED ORDER — MIDAZOLAM HCL 2 MG/2ML IJ SOLN
INTRAMUSCULAR | Status: AC
  Filled 2014-06-06: qty 2

## 2014-06-06 MED ORDER — CHLORHEXIDINE GLUCONATE 4 % EX LIQD: 60.0000 mL | Freq: Once | CUTANEOUS | Status: DC

## 2014-06-06 MED ORDER — FENTANYL CITRATE 0.05 MG/ML IJ SOLN
INTRAMUSCULAR | Status: AC
  Filled 2014-06-06: qty 2

## 2014-06-06 MED ORDER — CEFAZOLIN SODIUM-DEXTROSE 2-3 GM-% IV SOLR: 2.0000 g | INTRAVENOUS | Status: DC

## 2014-06-06 MED ORDER — HYDROCODONE-ACETAMINOPHEN 5-325 MG PO TABS: ORAL_TABLET | ORAL | Status: DC

## 2014-06-06 MED ORDER — BUPIVACAINE HCL (PF) 0.25 % IJ SOLN
INTRAMUSCULAR | Status: AC
  Filled 2014-06-06: qty 30

## 2014-06-06 MED ORDER — VANCOMYCIN HCL IN DEXTROSE 500-5 MG/100ML-% IV SOLN
INTRAVENOUS | Status: AC
  Filled 2014-06-06: qty 100

## 2014-06-06 MED ORDER — FENTANYL CITRATE 0.05 MG/ML IJ SOLN
INTRAMUSCULAR | Status: AC
Start: 2014-06-06 — End: 2014-06-06
  Filled 2014-06-06: qty 2

## 2014-06-06 MED ORDER — LACTATED RINGERS IV SOLN
INTRAVENOUS | Status: DC
  Administered 2014-06-06: 10:00:00 via INTRAVENOUS

## 2014-06-06 MED ORDER — ROPIVACAINE HCL 5 MG/ML IJ SOLN
INTRAMUSCULAR | Status: DC | PRN
  Administered 2014-06-06: 30 mL via PERINEURAL

## 2014-06-06 MED ORDER — MIDAZOLAM HCL 2 MG/2ML IJ SOLN: 1.0000 mg | INTRAMUSCULAR | Status: DC | PRN

## 2014-06-06 MED ORDER — FENTANYL CITRATE 0.05 MG/ML IJ SOLN
50.0000 ug | INTRAMUSCULAR | Status: DC | PRN
  Administered 2014-06-06: 50 ug via INTRAVENOUS

## 2014-06-06 MED ORDER — ONDANSETRON HCL 4 MG/2ML IJ SOLN
INTRAMUSCULAR | Status: DC | PRN
  Administered 2014-06-06: 4 mg via INTRAVENOUS

## 2014-06-06 SURGICAL SUPPLY — 58 items
BANDAGE ELASTIC 3 VELCRO ST LF (GAUZE/BANDAGES/DRESSINGS) ×3 IMPLANT
BLADE MINI RND TIP GREEN BEAV (BLADE) ×3 IMPLANT
BLADE SURG 15 STRL LF DISP TIS (BLADE) ×2 IMPLANT
BLADE SURG 15 STRL SS (BLADE) ×4
BNDG COHESIVE 1X5 TAN STRL LF (GAUZE/BANDAGES/DRESSINGS) ×6 IMPLANT
BNDG ELASTIC 2 VLCR STRL LF (GAUZE/BANDAGES/DRESSINGS) IMPLANT
BNDG ESMARK 4X9 LF (GAUZE/BANDAGES/DRESSINGS) ×3 IMPLANT
BNDG GAUZE ELAST 4 BULKY (GAUZE/BANDAGES/DRESSINGS) ×3 IMPLANT
BUR FAST CUTTING MED (BURR) IMPLANT
CHLORAPREP W/TINT 26ML (MISCELLANEOUS) ×3 IMPLANT
CORDS BIPOLAR (ELECTRODE) ×3 IMPLANT
COVER BACK TABLE 60X90IN (DRAPES) ×3 IMPLANT
COVER MAYO STAND STRL (DRAPES) ×3 IMPLANT
CUFF TOURNIQUET SINGLE 18IN (TOURNIQUET CUFF) ×3 IMPLANT
DRAPE EXTREMITY T 121X128X90 (DRAPE) ×3 IMPLANT
DRAPE OEC MINIVIEW 54X84 (DRAPES) ×3 IMPLANT
DRAPE SURG 17X23 STRL (DRAPES) ×3 IMPLANT
GAUZE SPONGE 4X4 12PLY STRL (GAUZE/BANDAGES/DRESSINGS) ×3 IMPLANT
GAUZE XEROFORM 1X8 LF (GAUZE/BANDAGES/DRESSINGS) ×3 IMPLANT
GLOVE BIO SURGEON STRL SZ7.5 (GLOVE) ×3 IMPLANT
GLOVE BIOGEL M 7.0 STRL (GLOVE) ×3 IMPLANT
GLOVE BIOGEL PI IND STRL 7.0 (GLOVE) ×1 IMPLANT
GLOVE BIOGEL PI IND STRL 7.5 (GLOVE) ×1 IMPLANT
GLOVE BIOGEL PI IND STRL 8 (GLOVE) ×1 IMPLANT
GLOVE BIOGEL PI IND STRL 8.5 (GLOVE) ×1 IMPLANT
GLOVE BIOGEL PI INDICATOR 7.0 (GLOVE) ×2
GLOVE BIOGEL PI INDICATOR 7.5 (GLOVE) ×2
GLOVE BIOGEL PI INDICATOR 8 (GLOVE) ×2
GLOVE BIOGEL PI INDICATOR 8.5 (GLOVE) ×2
GLOVE ECLIPSE 6.5 STRL STRAW (GLOVE) ×3 IMPLANT
GLOVE EXAM NITRILE EXT CFF LRG (GLOVE) ×3 IMPLANT
GLOVE SURG ORTHO 8.0 STRL STRW (GLOVE) ×3 IMPLANT
GLOVE SURG SS PI 8.5 STRL IVOR (GLOVE) ×2
GLOVE SURG SS PI 8.5 STRL STRW (GLOVE) ×1 IMPLANT
GOWN STRL REUS W/ TWL LRG LVL3 (GOWN DISPOSABLE) ×1 IMPLANT
GOWN STRL REUS W/TWL LRG LVL3 (GOWN DISPOSABLE) ×2
GOWN STRL REUS W/TWL XL LVL3 (GOWN DISPOSABLE) ×6 IMPLANT
K-WIRE .045 (WIRE) ×3 IMPLANT
NS IRRIG 1000ML POUR BTL (IV SOLUTION) ×3 IMPLANT
PACK BASIN DAY SURGERY FS (CUSTOM PROCEDURE TRAY) ×3 IMPLANT
PAD CAST 3X4 CTTN HI CHSV (CAST SUPPLIES) ×1 IMPLANT
PADDING CAST ABS 3INX4YD NS (CAST SUPPLIES)
PADDING CAST ABS 4INX4YD NS (CAST SUPPLIES) ×2
PADDING CAST ABS COTTON 3X4 (CAST SUPPLIES) IMPLANT
PADDING CAST ABS COTTON 4X4 ST (CAST SUPPLIES) ×1 IMPLANT
PADDING CAST COTTON 3X4 STRL (CAST SUPPLIES) ×2
SCREW LP 35MM (Screw) ×3 IMPLANT
SLEEVE SCD COMPRESS KNEE MED (MISCELLANEOUS) ×3 IMPLANT
SPLINT PLASTER CAST XFAST 3X15 (CAST SUPPLIES) IMPLANT
SPLINT PLASTER XTRA FASTSET 3X (CAST SUPPLIES)
STOCKINETTE 4X48 STRL (DRAPES) ×3 IMPLANT
SUT ETHILON 3 0 PS 1 (SUTURE) IMPLANT
SUT ETHILON 4 0 PS 2 18 (SUTURE) IMPLANT
SUT ETHILON 5 0 PC 1 (SUTURE) ×3 IMPLANT
SYR BULB 3OZ (MISCELLANEOUS) ×3 IMPLANT
SYR CONTROL 10ML LL (SYRINGE) IMPLANT
TOWEL OR 17X24 6PK STRL BLUE (TOWEL DISPOSABLE) ×3 IMPLANT
UNDERPAD 30X30 INCONTINENT (UNDERPADS AND DIAPERS) ×3 IMPLANT

## 2014-06-06 NOTE — Op Note (Signed)
[id number] 

## 2014-06-06 NOTE — Progress Notes (Signed)
Assisted Dr. Edmond Fitzgerald with right, ultrasound guided, supraclavicular block. Side rails up, monitors on throughout procedure. See vital signs in flow sheet. Tolerated Procedure well. 

## 2014-06-06 NOTE — Anesthesia Postprocedure Evaluation (Signed)
  Anesthesia Post-op Note  Patient: Robin Preston  Procedure(s) Performed: Procedure(s): ARTHRODESIS RIGHT THUMB INTRAPHALANGEAL JOINT (Right)  Patient Location: PACU  Anesthesia Type: MAC and block  Level of Consciousness: awake and alert   Airway and Oxygen Therapy: Patient Spontanous Breathing  Post-op Pain: none  Post-op Assessment: Post-op Vital signs reviewed, Patient's Cardiovascular Status Stable and Respiratory Function Stable  Post-op Vital Signs: Reviewed  Filed Vitals:   06/06/14 1245  BP: 141/58  Pulse: 48  Temp:   Resp: 14    Complications: No apparent anesthesia complications

## 2014-06-06 NOTE — Transfer of Care (Signed)
Immediate Anesthesia Transfer of Care Note  Patient: Robin Preston  Procedure(s) Performed: Procedure(s): ARTHRODESIS RIGHT THUMB INTRAPHALANGEAL JOINT (Right)  Patient Location: PACU  Anesthesia Type:MAC and Regional  Level of Consciousness: awake, alert  and oriented  Airway & Oxygen Therapy: Patient Spontanous Breathing and Patient connected to face mask oxygen  Post-op Assessment: Report given to PACU RN and Post -op Vital signs reviewed and stable  Post vital signs: Reviewed and stable  Complications: No apparent anesthesia complications

## 2014-06-06 NOTE — H&P (Signed)
Robin Preston is an 68 y.o. female.   Chief Complaint: right thumb ip arhritis HPI: 67 yo rhd female with pain and instability of right thumb ip joint.  This is very bothersome to her.  She wishes to have a fusion of the right thumb ip joint for management of symptoms.  Past Medical History  Diagnosis Date  . Thyroid disease   . Dyspnea   . Dizziness 12/2011    Onset in 12/2011  . Sinus bradycardia   . Degenerative joint disease     Hands; Rx -parenteral steroids    Past Surgical History  Procedure Laterality Date  . Abdominal hysterectomy    . Colonoscopy w/ polypectomy  01/2010    internal hemorrhoids;9 mm rectal tubular adenoma  . Skin cancer excision      Nose  . 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 Cataract extraction w/phaco Right 10/04/2012    Procedure: CATARACT EXTRACTION PHACO AND INTRAOCULAR LENS PLACEMENT (IOC);  Surgeon: Tonny Branch, MD;  Location: [facility];  Service: Ophthalmology;  Laterality: Right;  CDE:  15.78  . Irrigation and debridement abscess Left 05/02/2014    Procedure: MINOR INCISION AND DRAINAGE OF LEFT RING FINGER ABSCESS;  Surgeon: Leanora Cover, MD;  Location: [facility];  Service: Orthopedics;  Laterality: Left;  I & D LEFT RING FINGER    History reviewed. No pertinent family history. Social History:  reports that she has never smoked. She does not have any smokeless tobacco history on file. She reports that she does not drink alcohol or use illicit drugs.  Allergies:  Allergies  Allergen Reactions  . Sulfa Antibiotics Rash    Medications Prior to Admission  Medication Sig Dispense Refill  . estrogens, conjugated, (PREMARIN) 0.625 MG tablet Take 0.625 mg by mouth every other day.     Marland Kitchen HYDROcodone-acetaminophen (NORCO/VICODIN) 5-325 MG per tablet Take 1 tablet by mouth every 6 (six) hours as needed. For pain    . ibuprofen (ADVIL,MOTRIN) 200 MG tablet Take 200 mg by mouth every 6 (six) hours as needed for pain.    .  meloxicam (MOBIC) 7.5 MG tablet Take 7.5 mg by mouth daily.    . methimazole (TAPAZOLE) 10 MG tablet Take 10 mg by mouth every morning.       Results for orders placed or performed during the hospital encounter of 06/06/14 (from the past 48 hour(s))  Hemoglobin-hemacue, POC     Status: None   Collection Time: 06/06/14 10:19 AM  Result Value Ref Range   Hemoglobin 12.3 12.0 - 15.0 g/dL    No results found.   A comprehensive review of systems was negative except for: Eyes: positive for contacts/glasses Ears, nose, mouth, throat, and face: positive for tinnitus Integument/breast: positive for rash Neurological: positive for gait problems  Blood pressure 143/64, pulse 51, temperature 97.8 F (36.6 C), temperature source Oral, resp. rate 14, height 5\' 1"  (1.549 m), weight 44.906 kg (99 lb), SpO2 100 %.  General appearance: alert, cooperative and appears stated age Head: Normocephalic, without obvious abnormality, atraumatic Neck: supple, symmetrical, trachea midline Resp: clear to auscultation bilaterally Cardio: regular rate and rhythm GI: non tender Extremities: intact sensation and capillary refill all digits.  +epl/fpl/io.  no wounds.  no erythema. Pulses: 2+ and symmetric Skin: Skin color, texture, turgor normal. No rashes or lesions Neurologic: Grossly normal Incision/Wound: none  Assessment/Plan Right thumb ip arthritis.  Non operative and operative treatment options were discussed with the  patient and patient wishes to proceed with operative treatment. She wishes to proceed with fusion of right thumb ip joint.  Risks, benefits, and alternatives of surgery were discussed and the patient agrees with the plan of care.   Robin Preston R 06/06/2014, 10:44 AM

## 2014-06-06 NOTE — Brief Op Note (Signed)
06/06/2014  12:21 PM  PATIENT:  Robin Preston  68 y.o. 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  POST-OPERATIVE DIAGNOSIS:  END STAGE OSTEOARTHRITIS DEGENERATION RIGHT THUMB IP JOINT  PROCEDURE:  Procedure(s): ARTHRODESIS RIGHT THUMB INTRAPHALANGEAL JOINT (Right)  SURGEON:  Surgeon(s) and Role:    * Leanora Cover, MD - Primary    * Daryll Brod, MD - Assisting  PHYSICIAN ASSISTANT:   ASSISTANTS: Daryll Brod, MD   ANESTHESIA:   regional  EBL:  Total I/O In: 78 [I.V.:700] Out: -   BLOOD ADMINISTERED:none  DRAINS: none   LOCAL MEDICATIONS USED:  NONE  SPECIMEN:  No Specimen  DISPOSITION OF SPECIMEN:  N/A  COUNTS:  YES  TOURNIQUET:   Total Tourniquet Time Documented: Upper Arm (Right) - 52 minutes Total: Upper Arm (Right) - 52 minutes   DICTATION: .Other Dictation: Dictation Number [id number]  PLAN OF CARE: Discharge to home after PACU  PATIENT DISPOSITION:  PACU - hemodynamically stable.

## 2014-06-06 NOTE — Discharge Instructions (Addendum)
[facility] Instructions °Hand Surgery ° °Wound Care: °Keep your hand elevated above the level of your heart.  Do not allow it to dangle by your side.  Keep the dressing dry and do not remove it unless your doctor advises you to do so.  He will usually change it at the time of your post-op visit.  Moving your fingers is advised to stimulate circulation but will depend on the site of your surgery.  If you have a splint applied, your doctor will advise you regarding movement. ° °Activity: °Do not drive or operate machinery today.  Rest today and then you may return to your normal activity and work as indicated by your physician. ° °Diet:  °Drink liquids today or eat a light diet.  You may resume a regular diet tomorrow.   ° °General expectations: °Pain for two to three days. °Fingers may become slightly swollen. ° °Call your doctor if any of the following occur: °Severe pain not relieved by pain medication. °Elevated temperature. °Dressing soaked with blood. °Inability to move fingers. °White or bluish color to fingers. ° ° °Post Anesthesia Home Care Instructions ° °Activity: °Get plenty of rest for the remainder of the day. A responsible adult should stay with you for 24 hours following the procedure.  °For the next 24 hours, DO NOT: °-Drive a car °-Operate machinery °-Drink alcoholic beverages °-Take any medication unless instructed by your physician °-Make any legal decisions or sign important papers. ° °Meals: °Start with liquid foods such as gelatin or soup. Progress to regular foods as tolerated. Avoid greasy, spicy, heavy foods. If nausea and/or vomiting occur, drink only clear liquids until the nausea and/or vomiting subsides. Call your physician if vomiting continues. ° °Special Instructions/Symptoms: °Your throat may feel dry or sore from the anesthesia or the breathing tube placed in your throat during surgery. If this causes discomfort, gargle with warm salt water. The discomfort should disappear within 24  hours. ° ° °Regional Anesthesia Blocks ° °1. Numbness or the inability to move the "blocked" extremity may last from 3-48 hours after placement. The length of time depends on the medication injected and your individual response to the medication. If the numbness is not going away after 48 hours, call your surgeon. ° °2. The extremity that is blocked will need to be protected until the numbness is gone and the  Strength has returned. Because you cannot feel it, you will need to take extra care to avoid injury. Because it may be weak, you may have difficulty moving it or using it. You may not know what position it is in without looking at it while the block is in effect. ° °3. For blocks in the legs and feet, returning to weight bearing and walking needs to be done carefully. You will need to wait until the numbness is entirely gone and the strength has returned. You should be able to move your leg and foot normally before you try and bear weight or walk. You will need someone to be with you when you first try to ensure you do not fall and possibly risk injury. ° °4. Bruising and tenderness at the needle site are common side effects and will resolve in a few days. ° °5. Persistent numbness or new problems with movement should be communicated to the surgeon or the [facility] ([phone])/ [facility] ([phone]). °

## 2014-06-06 NOTE — Anesthesia Procedure Notes (Addendum)
Anesthesia Regional Block:  Supraclavicular block  Pre-Anesthetic Checklist: ,, timeout performed, Correct Patient, Correct Site, Correct Laterality, Correct Procedure, Correct Position, site marked, Risks and benefits discussed, pre-op evaluation, post-op pain management  Laterality: Right  Prep: Maximum Sterile Barrier Precautions used and chloraprep       Needles:  Injection technique: Single-shot  Needle Type: Echogenic Stimulator Needle     Needle Length: 5cm 5 cm Needle Gauge: 22 and 22 G    Additional Needles:  Procedures: ultrasound guided (picture in chart) Supraclavicular block Narrative:  Start time: 06/06/2014 10:30 AM End time: 06/06/2014 10:37 AM Injection made incrementally with aspirations every 5 mL.  Performed by: Personally  Anesthesiologist: Roderic Palau E  Additional Notes: 2% Lidocaine skin wheel.

## 2014-06-06 NOTE — Anesthesia Preprocedure Evaluation (Addendum)
Anesthesia Evaluation  Patient identified by MRN, date of birth, ID band Patient awake    Reviewed: Allergy & Precautions, H&P , NPO status , Patient's Chart, lab work & pertinent test results  Airway Mallampati: II  TM Distance: >3 FB Neck ROM: Full    Dental no notable dental hx. (+) Partial Upper, Dental Advisory Given   Pulmonary shortness of breath and with exertion,  breath sounds clear to auscultation  Pulmonary exam normal       Cardiovascular negative cardio ROS  Rhythm:Regular Rate:Normal     Neuro/Psych negative neurological ROS  negative psych ROS   GI/Hepatic negative GI ROS, Neg liver ROS,   Endo/Other  negative endocrine ROS  Renal/GU negative Renal ROS  negative genitourinary   Musculoskeletal  (+) Arthritis -, Osteoarthritis,    Abdominal   Peds  Hematology negative hematology ROS (+)   Anesthesia Other Findings   Reproductive/Obstetrics negative OB ROS                            Anesthesia Physical Anesthesia Plan  ASA: II  Anesthesia Plan: Regional and MAC   Post-op Pain Management:    Induction: Intravenous  Airway Management Planned: Simple Face Mask  Additional Equipment:   Intra-op Plan:   Post-operative Plan:   Informed Consent: I have reviewed the patients History and Physical, chart, labs and discussed the procedure including the risks, benefits and alternatives for the proposed anesthesia with the patient or authorized representative who has indicated his/her understanding and acceptance.   Dental advisory given  Plan Discussed with: CRNA  Anesthesia Plan Comments:        Anesthesia Quick Evaluation

## 2014-06-06 NOTE — Op Note (Signed)
Intra-operative fluoroscopic images in the AP, lateral, and oblique views were taken and evaluated by myself.  Reduction and hardware placement were confirmed.  There was no intraarticular penetration of permanent hardware.  

## 2014-06-07 ENCOUNTER — Encounter (HOSPITAL_BASED_OUTPATIENT_CLINIC_OR_DEPARTMENT_OTHER): Payer: Self-pay | Admitting: Orthopedic Surgery

## 2014-06-07 NOTE — Op Note (Signed)
[name], Preston                   ACCOUNT NO.:  [id number]  MEDICAL RECORD NO.:  [id number]  LOCATION:                                 FACILITY:  PHYSICIAN:  Leanora Cover, MD        DATE OF BIRTH:  [birth year]  DATE OF PROCEDURE:  06/06/2014 DATE OF DISCHARGE:                              OPERATIVE REPORT   PREOPERATIVE DIAGNOSIS:  Right thumb IP joint arthritis.  POSTOPERATIVE DIAGNOSIS:  Right thumb IP joint arthritis.  PROCEDURE:  Right thumb IP joint arthrodesis.  SURGEON:  Leanora Cover, MD.  ASSISTANT:  Daryll Brod, M.D.  ANESTHESIA:  Regional with sedation.  IV FLUIDS:  Per anesthesia flow sheet.  ESTIMATED BLOOD LOSS:  Minimal.  COMPLICATIONS:  None.  SPECIMENS:  None.  TIME OF TOURNIQUET:  52 minutes.  DISPOSITION:  Stable to PACU.  INDICATIONS:  Robin Preston is a 68 year old right-hand-dominant female who has had pain and instability at the right thumb IP joint.  It is very bothersome to her.  It interferes with her activities.  She wished to have a fusion.  Risks, benefits, and alternatives of surgery were discussed including risk of blood loss; infection; damage to nerves, vessels, tendons, ligaments, bone; failure of surgery; need for additional surgery; complications with wound healing; continued pain; and of loss fixation.  She voiced understanding these risks and elected to proceed.  OPERATIVE COURSE:  After being identified preoperatively by myself, the patient and I agreed upon procedure and site procedure.  Surgical site was marked.  The risks, benefits, and alternatives of surgery were reviewed and she wished to proceed.  Surgical consent had been signed. She was given IV vancomycin as preoperative antibiotic prophylaxis.  She had a small wound on the long finger.  There was no erythema or proximal streaking.  It was felt it was safe to proceed with the procedure.  She was transferred to the operating room and placed on the operating room table in  supine position.  The right upper extremity on arm board. Regional block had been performed by Anesthesia in pre-operating holding.  She was sedated in the OR.  The right upper extremity was prepped and draped in normal sterile orthopedic fashion.  Surgical pause was performed between surgeons, Anesthesia, operating staff, and all were in agreement as to the patient, procedure, and site of procedure. Tourniquet at the proximal aspect of the extremity was inflated to 250 mmHg after exsanguination of the limb with Esmarch bandage.  An incision was made over the IP joint of thumb dorsally.  It was carried into subcutaneous tissues by spreading technique.  The extensor tendon was incised leaving a cuff for later repair.  The joint was entered.  The radial and ulnar collateral ligaments were released with the [facility] blade.  The joint was significantly eroded.  The rongeurs were used to remove the condyles of the proximal phalanx down to cancellous bone. The proximal aspect of the distal phalanx was treated with the rongeurs as well to come down to cancellous bone.  There was deformity of the proximal aspect of the distal phalanx.  The proximal phalanx was then contoured to match  this.  The guidewire from the Arex set was selected and advanced through the distal phalanx in an antegrade fashion followed by retrograde advancement back into the proximal phalanx.  This was confirmed under C-arm.  The preparation of the arthrodesis site had been confirmed under C-arm as well.  AP and lateral projections were used. The 35 mm screw was felt to be appropriate after sizing using the C-arm. It was advanced over the guidewire.  Good compression was obtained.  The C-arm was used in AP and lateral projections to ensure appropriate reduction of the arthrodesis site, position of hardware which was the case.  The wound was copiously irrigated with sterile saline.  The tendon was repaired back over top of the  joint using 4-0 Mersilene suture in a figure-of-eight fashion.  The skin was closed both dorsally and at the tip of the thumb with a 5-0 nylon suture.  The wound was dressed with sterile Xeroform and 4 x 4, and an Alumafoam splint was placed and wrapped with a Coban dressing lightly.  Tourniquet was deflated at 52 minutes.  Fingertips were pink with brisk capillary refill after deflation of tourniquet.  Operative drapes were broken down.  The patient was awakened from anesthesia safely.  She was transferred back to stretcher and taken to PACU in stable condition.  I will see her back in the office in 1 week for postoperative followup.  I will give her Norco 5/325, 1-2 p.o. q.6 hours p.r.n. pain, dispensed #40.     Leanora Cover, MD     KK/MEDQ  D:  06/06/2014  T:  06/07/2014  Job:  [id number]

## 2014-06-14 DIAGNOSIS — M19041 Primary osteoarthritis, right hand: Secondary | ICD-10-CM | POA: Diagnosis not present

## 2014-07-12 DIAGNOSIS — M19041 Primary osteoarthritis, right hand: Secondary | ICD-10-CM | POA: Diagnosis not present

## 2014-07-19 DIAGNOSIS — N390 Urinary tract infection, site not specified: Secondary | ICD-10-CM | POA: Diagnosis not present

## 2014-07-19 DIAGNOSIS — K648 Other hemorrhoids: Secondary | ICD-10-CM | POA: Diagnosis not present

## 2014-07-19 DIAGNOSIS — K625 Hemorrhage of anus and rectum: Secondary | ICD-10-CM | POA: Diagnosis not present

## 2014-07-19 DIAGNOSIS — Z681 Body mass index (BMI) 19 or less, adult: Secondary | ICD-10-CM | POA: Diagnosis not present

## 2014-08-23 DIAGNOSIS — M19041 Primary osteoarthritis, right hand: Secondary | ICD-10-CM | POA: Diagnosis not present

## 2014-09-21 DIAGNOSIS — R5383 Other fatigue: Secondary | ICD-10-CM | POA: Diagnosis not present

## 2014-09-21 DIAGNOSIS — Z113 Encounter for screening for infections with a predominantly sexual mode of transmission: Secondary | ICD-10-CM | POA: Diagnosis not present

## 2014-09-21 DIAGNOSIS — M2550 Pain in unspecified joint: Secondary | ICD-10-CM | POA: Diagnosis not present

## 2014-09-26 DIAGNOSIS — M79641 Pain in right hand: Secondary | ICD-10-CM | POA: Diagnosis not present

## 2014-09-26 DIAGNOSIS — L4050 Arthropathic psoriasis, unspecified: Secondary | ICD-10-CM | POA: Diagnosis not present

## 2014-09-26 DIAGNOSIS — Z79899 Other long term (current) drug therapy: Secondary | ICD-10-CM | POA: Diagnosis not present

## 2014-09-26 DIAGNOSIS — L408 Other psoriasis: Secondary | ICD-10-CM | POA: Diagnosis not present

## 2014-10-18 DIAGNOSIS — R269 Unspecified abnormalities of gait and mobility: Secondary | ICD-10-CM | POA: Diagnosis not present

## 2014-10-18 DIAGNOSIS — M2660 Temporomandibular joint disorder, unspecified: Secondary | ICD-10-CM | POA: Diagnosis not present

## 2014-10-18 DIAGNOSIS — R42 Dizziness and giddiness: Secondary | ICD-10-CM | POA: Diagnosis not present

## 2014-10-24 DIAGNOSIS — Z681 Body mass index (BMI) 19 or less, adult: Secondary | ICD-10-CM | POA: Diagnosis not present

## 2014-10-24 DIAGNOSIS — Z23 Encounter for immunization: Secondary | ICD-10-CM | POA: Diagnosis not present

## 2014-10-24 DIAGNOSIS — M1991 Primary osteoarthritis, unspecified site: Secondary | ICD-10-CM | POA: Diagnosis not present

## 2014-10-24 DIAGNOSIS — M810 Age-related osteoporosis without current pathological fracture: Secondary | ICD-10-CM | POA: Diagnosis not present

## 2014-10-24 DIAGNOSIS — R42 Dizziness and giddiness: Secondary | ICD-10-CM | POA: Diagnosis not present

## 2014-10-24 DIAGNOSIS — L4051 Distal interphalangeal psoriatic arthropathy: Secondary | ICD-10-CM | POA: Diagnosis not present

## 2014-10-24 DIAGNOSIS — R946 Abnormal results of thyroid function studies: Secondary | ICD-10-CM | POA: Diagnosis not present

## 2014-11-02 DIAGNOSIS — N342 Other urethritis: Secondary | ICD-10-CM | POA: Diagnosis not present

## 2014-11-02 DIAGNOSIS — Z681 Body mass index (BMI) 19 or less, adult: Secondary | ICD-10-CM | POA: Diagnosis not present

## 2014-11-30 DIAGNOSIS — Z681 Body mass index (BMI) 19 or less, adult: Secondary | ICD-10-CM | POA: Diagnosis not present

## 2014-11-30 DIAGNOSIS — L089 Local infection of the skin and subcutaneous tissue, unspecified: Secondary | ICD-10-CM | POA: Diagnosis not present

## 2014-12-26 DIAGNOSIS — Z79899 Other long term (current) drug therapy: Secondary | ICD-10-CM | POA: Diagnosis not present

## 2015-01-09 DIAGNOSIS — Z79899 Other long term (current) drug therapy: Secondary | ICD-10-CM | POA: Diagnosis not present

## 2015-03-01 DIAGNOSIS — M79641 Pain in right hand: Secondary | ICD-10-CM | POA: Diagnosis not present

## 2015-03-01 DIAGNOSIS — Z09 Encounter for follow-up examination after completed treatment for conditions other than malignant neoplasm: Secondary | ICD-10-CM | POA: Diagnosis not present

## 2015-03-01 DIAGNOSIS — M25511 Pain in right shoulder: Secondary | ICD-10-CM | POA: Diagnosis not present

## 2015-03-01 DIAGNOSIS — L4050 Arthropathic psoriasis, unspecified: Secondary | ICD-10-CM | POA: Diagnosis not present

## 2015-03-26 DIAGNOSIS — Z79899 Other long term (current) drug therapy: Secondary | ICD-10-CM | POA: Diagnosis not present

## 2015-04-17 DIAGNOSIS — Z23 Encounter for immunization: Secondary | ICD-10-CM | POA: Diagnosis not present

## 2015-05-01 DIAGNOSIS — J019 Acute sinusitis, unspecified: Secondary | ICD-10-CM | POA: Diagnosis not present

## 2015-05-01 DIAGNOSIS — Z1389 Encounter for screening for other disorder: Secondary | ICD-10-CM | POA: Diagnosis not present

## 2015-05-01 DIAGNOSIS — Z681 Body mass index (BMI) 19 or less, adult: Secondary | ICD-10-CM | POA: Diagnosis not present

## 2015-05-01 DIAGNOSIS — H6693 Otitis media, unspecified, bilateral: Secondary | ICD-10-CM | POA: Diagnosis not present

## 2015-05-15 DIAGNOSIS — Z681 Body mass index (BMI) 19 or less, adult: Secondary | ICD-10-CM | POA: Diagnosis not present

## 2015-05-15 DIAGNOSIS — Z1389 Encounter for screening for other disorder: Secondary | ICD-10-CM | POA: Diagnosis not present

## 2015-05-15 DIAGNOSIS — J329 Chronic sinusitis, unspecified: Secondary | ICD-10-CM | POA: Diagnosis not present

## 2015-05-15 DIAGNOSIS — H6691 Otitis media, unspecified, right ear: Secondary | ICD-10-CM | POA: Diagnosis not present

## 2015-05-21 DIAGNOSIS — Z79899 Other long term (current) drug therapy: Secondary | ICD-10-CM | POA: Diagnosis not present

## 2015-05-24 DIAGNOSIS — L4050 Arthropathic psoriasis, unspecified: Secondary | ICD-10-CM | POA: Diagnosis not present

## 2015-05-24 DIAGNOSIS — M79641 Pain in right hand: Secondary | ICD-10-CM | POA: Diagnosis not present

## 2015-05-24 DIAGNOSIS — L408 Other psoriasis: Secondary | ICD-10-CM | POA: Diagnosis not present

## 2015-05-24 DIAGNOSIS — Z09 Encounter for follow-up examination after completed treatment for conditions other than malignant neoplasm: Secondary | ICD-10-CM | POA: Diagnosis not present

## 2015-06-14 DIAGNOSIS — R42 Dizziness and giddiness: Secondary | ICD-10-CM | POA: Diagnosis not present

## 2015-06-14 DIAGNOSIS — Z681 Body mass index (BMI) 19 or less, adult: Secondary | ICD-10-CM | POA: Diagnosis not present

## 2015-06-14 DIAGNOSIS — I959 Hypotension, unspecified: Secondary | ICD-10-CM | POA: Diagnosis not present

## 2015-07-13 DIAGNOSIS — Z0001 Encounter for general adult medical examination with abnormal findings: Secondary | ICD-10-CM | POA: Diagnosis not present

## 2015-07-13 DIAGNOSIS — H6993 Unspecified Eustachian tube disorder, bilateral: Secondary | ICD-10-CM | POA: Diagnosis not present

## 2015-07-13 DIAGNOSIS — E0590 Thyrotoxicosis, unspecified without thyrotoxic crisis or storm: Secondary | ICD-10-CM | POA: Diagnosis not present

## 2015-07-13 DIAGNOSIS — Z1389 Encounter for screening for other disorder: Secondary | ICD-10-CM | POA: Diagnosis not present

## 2015-07-13 DIAGNOSIS — F419 Anxiety disorder, unspecified: Secondary | ICD-10-CM | POA: Diagnosis not present

## 2015-07-13 DIAGNOSIS — Z681 Body mass index (BMI) 19 or less, adult: Secondary | ICD-10-CM | POA: Diagnosis not present

## 2015-08-01 DIAGNOSIS — L400 Psoriasis vulgaris: Secondary | ICD-10-CM | POA: Diagnosis not present

## 2015-08-01 DIAGNOSIS — M79641 Pain in right hand: Secondary | ICD-10-CM | POA: Diagnosis not present

## 2015-08-01 DIAGNOSIS — Z79899 Other long term (current) drug therapy: Secondary | ICD-10-CM | POA: Diagnosis not present

## 2015-08-15 ENCOUNTER — Other Ambulatory Visit (HOSPITAL_COMMUNITY): Payer: Self-pay | Admitting: Family Medicine

## 2015-08-15 DIAGNOSIS — Z1231 Encounter for screening mammogram for malignant neoplasm of breast: Secondary | ICD-10-CM

## 2015-08-22 ENCOUNTER — Ambulatory Visit (HOSPITAL_COMMUNITY): Payer: Medicare Other

## 2015-09-05 DIAGNOSIS — F4320 Adjustment disorder, unspecified: Secondary | ICD-10-CM | POA: Diagnosis not present

## 2015-09-05 DIAGNOSIS — Z681 Body mass index (BMI) 19 or less, adult: Secondary | ICD-10-CM | POA: Diagnosis not present

## 2015-09-05 DIAGNOSIS — J069 Acute upper respiratory infection, unspecified: Secondary | ICD-10-CM | POA: Diagnosis not present

## 2015-09-05 DIAGNOSIS — J209 Acute bronchitis, unspecified: Secondary | ICD-10-CM | POA: Diagnosis not present

## 2015-09-05 DIAGNOSIS — Z1389 Encounter for screening for other disorder: Secondary | ICD-10-CM | POA: Diagnosis not present

## 2015-09-07 DIAGNOSIS — Z79899 Other long term (current) drug therapy: Secondary | ICD-10-CM | POA: Diagnosis not present

## 2015-09-09 IMAGING — CR DG CHEST 2V
2 series · 2 of 2 positions shown · non-contrast
Comparison: 09/21/2011

CLINICAL DATA: Immunosuppressive therapy.  Screening.

EXAM:
CHEST  2 VIEW

[view not recorded (1 of 2)]
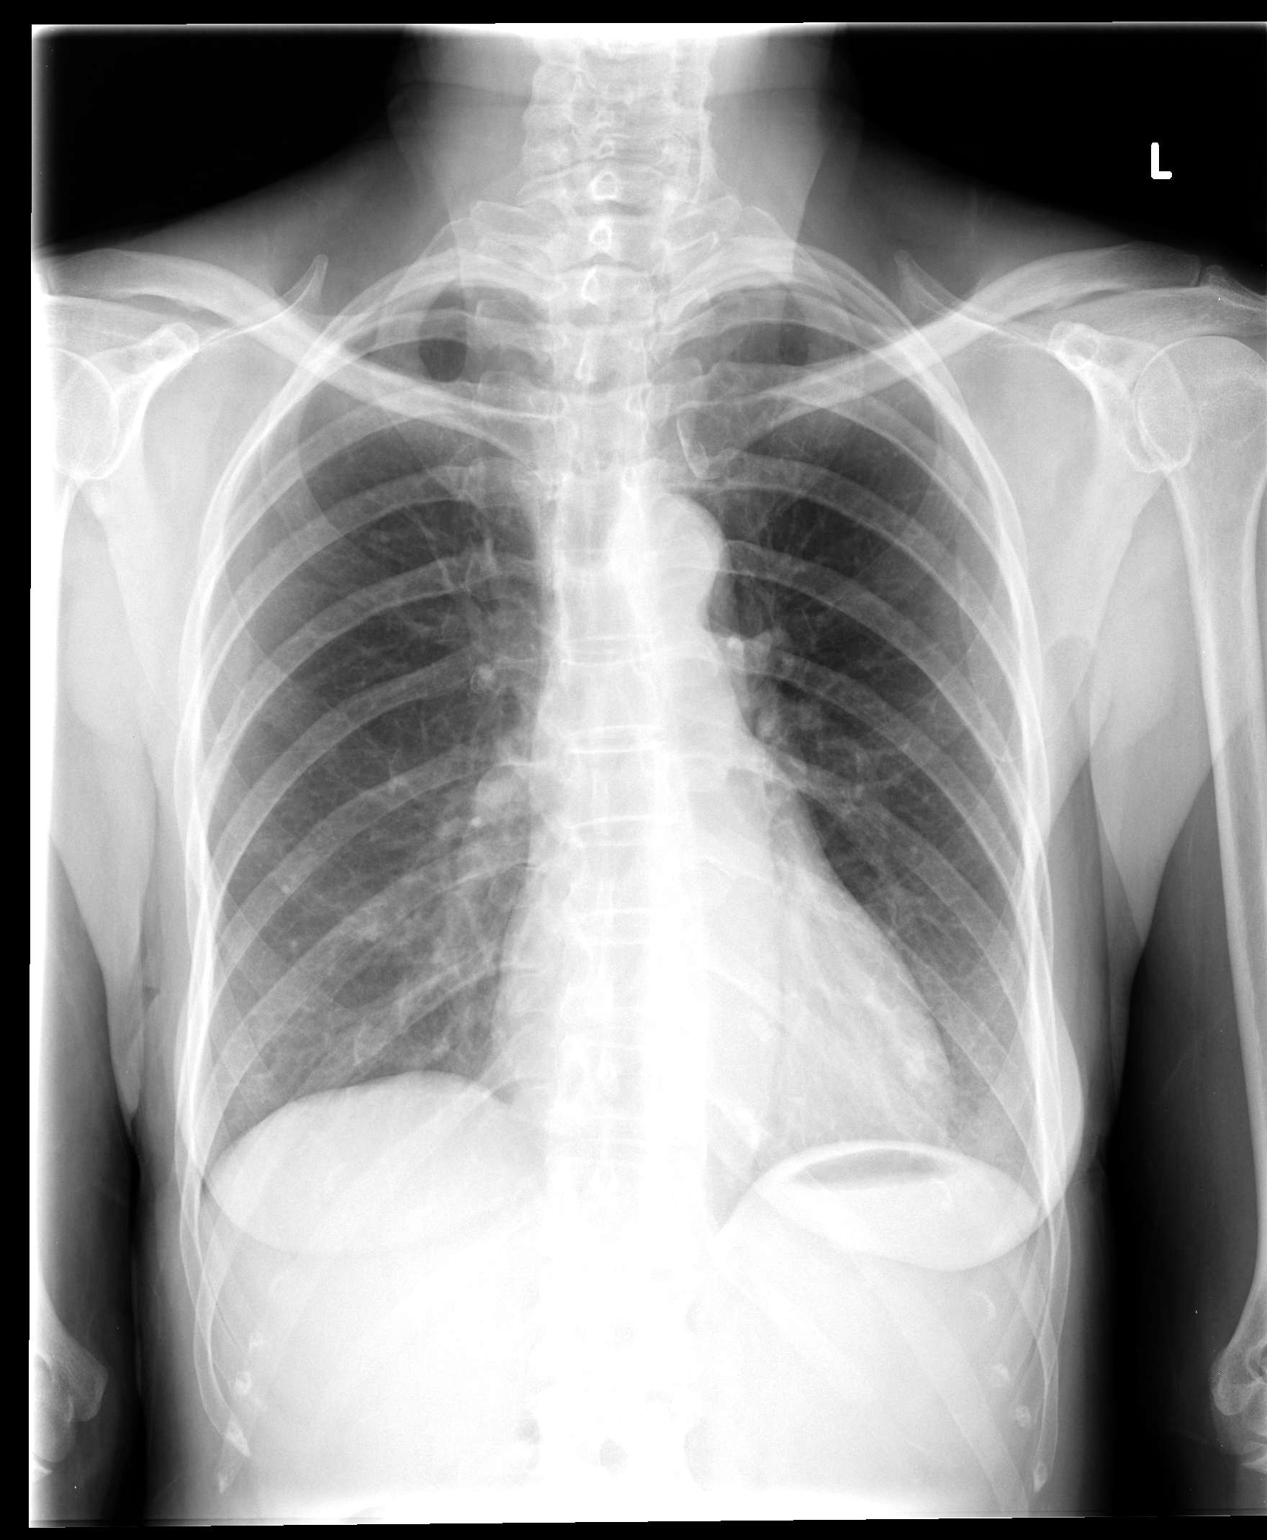

[view not recorded (2 of 2)]
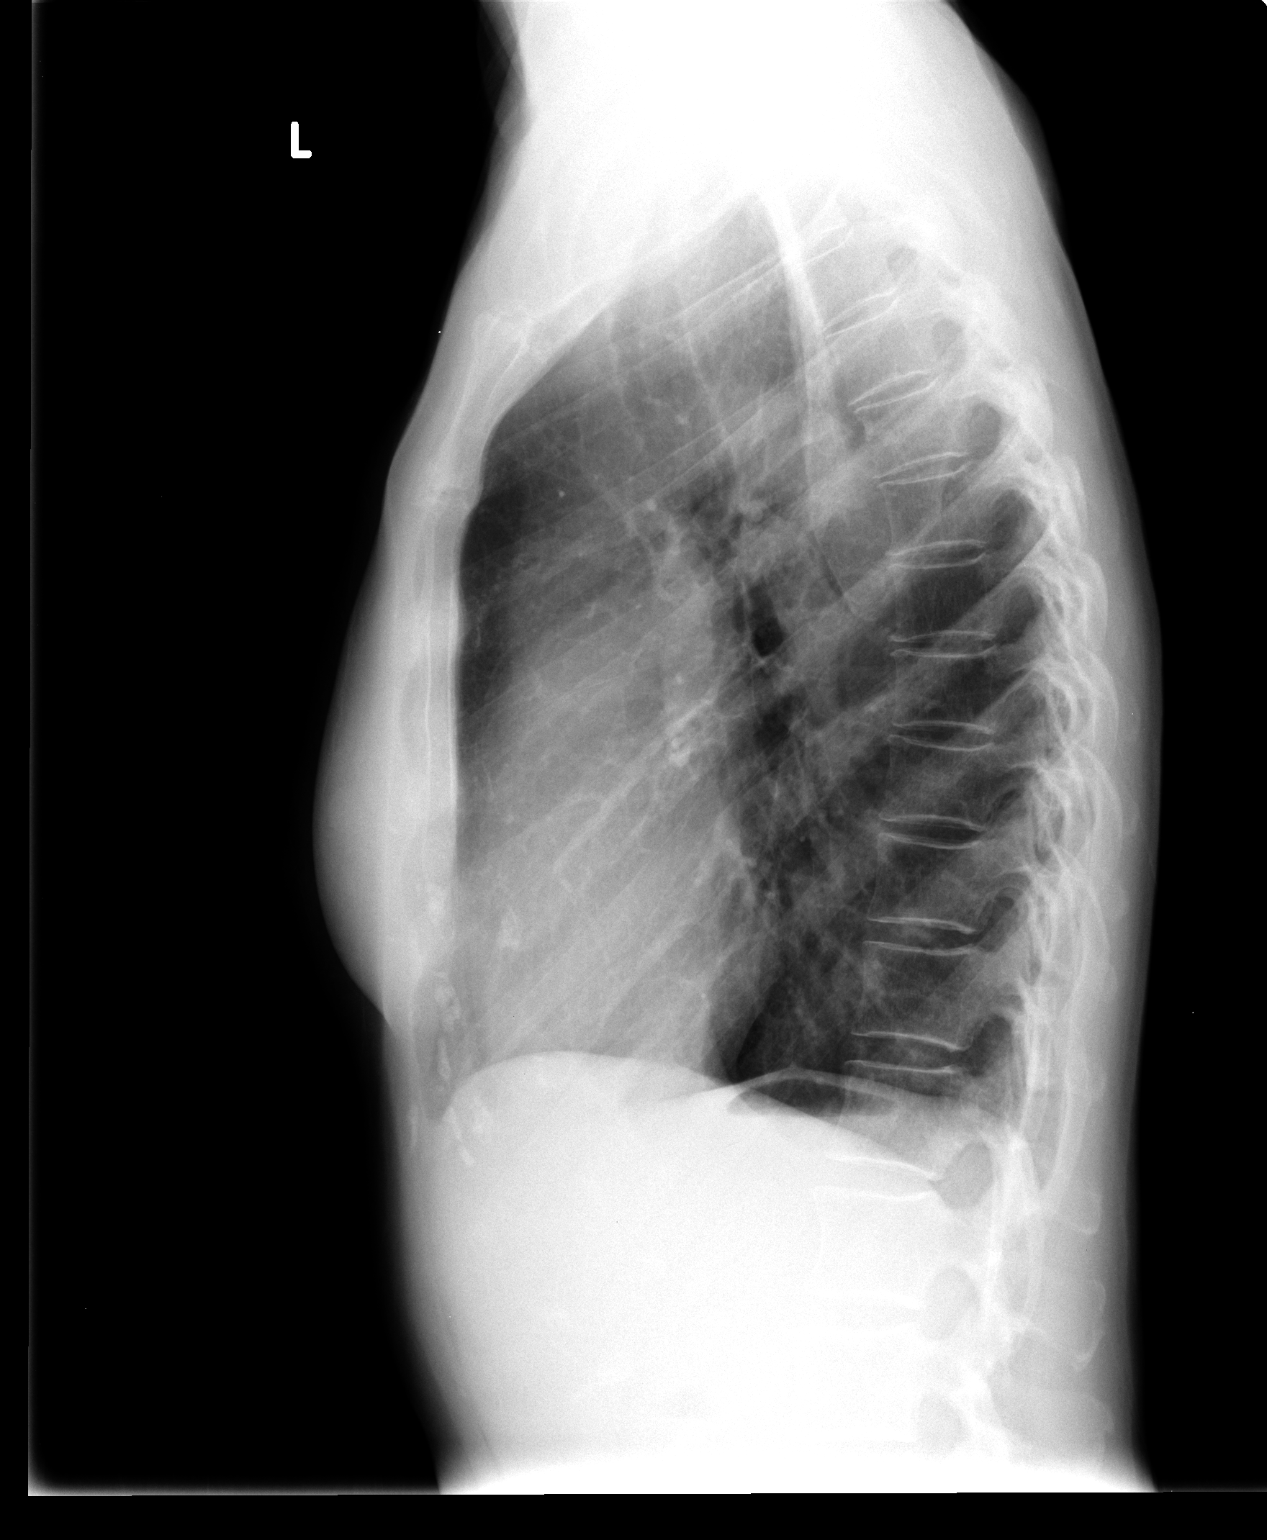

[2 of 2 positions shown; findings below may reference images not displayed]

FINDINGS: The heart size and mediastinal contours are within normal limits.
Both lungs are clear. There is slight thoracic scoliosis. No acute
osseous abnormality.
IMPRESSION: No significant abnormalities.  No change since the prior study.

## 2015-11-15 ENCOUNTER — Ambulatory Visit (INDEPENDENT_AMBULATORY_CARE_PROVIDER_SITE_OTHER): Payer: Medicare Other | Admitting: Otolaryngology

## 2015-11-15 DIAGNOSIS — R490 Dysphonia: Secondary | ICD-10-CM | POA: Diagnosis not present

## 2015-11-15 DIAGNOSIS — R42 Dizziness and giddiness: Secondary | ICD-10-CM

## 2015-11-15 DIAGNOSIS — H903 Sensorineural hearing loss, bilateral: Secondary | ICD-10-CM | POA: Diagnosis not present

## 2016-01-10 ENCOUNTER — Ambulatory Visit (INDEPENDENT_AMBULATORY_CARE_PROVIDER_SITE_OTHER): Payer: Medicare Other | Admitting: Otolaryngology

## 2016-01-10 DIAGNOSIS — R490 Dysphonia: Secondary | ICD-10-CM | POA: Diagnosis not present

## 2016-01-10 DIAGNOSIS — K219 Gastro-esophageal reflux disease without esophagitis: Secondary | ICD-10-CM | POA: Diagnosis not present

## 2016-02-21 ENCOUNTER — Ambulatory Visit (INDEPENDENT_AMBULATORY_CARE_PROVIDER_SITE_OTHER): Payer: Medicare Other | Admitting: Otolaryngology

## 2016-02-21 DIAGNOSIS — K219 Gastro-esophageal reflux disease without esophagitis: Secondary | ICD-10-CM | POA: Diagnosis not present

## 2016-02-21 DIAGNOSIS — R490 Dysphonia: Secondary | ICD-10-CM

## 2016-02-26 DIAGNOSIS — Z681 Body mass index (BMI) 19 or less, adult: Secondary | ICD-10-CM | POA: Diagnosis not present

## 2016-02-26 DIAGNOSIS — N342 Other urethritis: Secondary | ICD-10-CM | POA: Diagnosis not present

## 2016-02-26 DIAGNOSIS — Z1389 Encounter for screening for other disorder: Secondary | ICD-10-CM | POA: Diagnosis not present

## 2016-02-26 DIAGNOSIS — E039 Hypothyroidism, unspecified: Secondary | ICD-10-CM | POA: Diagnosis not present

## 2016-02-26 DIAGNOSIS — M810 Age-related osteoporosis without current pathological fracture: Secondary | ICD-10-CM | POA: Diagnosis not present

## 2016-02-26 DIAGNOSIS — H6121 Impacted cerumen, right ear: Secondary | ICD-10-CM | POA: Diagnosis not present

## 2016-03-06 DIAGNOSIS — Z1389 Encounter for screening for other disorder: Secondary | ICD-10-CM | POA: Diagnosis not present

## 2016-03-06 DIAGNOSIS — H6121 Impacted cerumen, right ear: Secondary | ICD-10-CM | POA: Diagnosis not present

## 2016-03-06 DIAGNOSIS — R946 Abnormal results of thyroid function studies: Secondary | ICD-10-CM | POA: Diagnosis not present

## 2016-03-06 DIAGNOSIS — Z681 Body mass index (BMI) 19 or less, adult: Secondary | ICD-10-CM | POA: Diagnosis not present

## 2016-03-06 DIAGNOSIS — E785 Hyperlipidemia, unspecified: Secondary | ICD-10-CM | POA: Diagnosis not present

## 2016-03-06 DIAGNOSIS — Z0000 Encounter for general adult medical examination without abnormal findings: Secondary | ICD-10-CM | POA: Diagnosis not present

## 2016-03-27 ENCOUNTER — Ambulatory Visit (HOSPITAL_COMMUNITY)
Admission: RE | Admit: 2016-03-27 | Discharge: 2016-03-27 | Disposition: A | Discharge status: Home / Self Care | Payer: Medicare Other | Source: Ambulatory Visit | Attending: Family Medicine | Admitting: Family Medicine

## 2016-03-27 DIAGNOSIS — Z23 Encounter for immunization: Secondary | ICD-10-CM | POA: Diagnosis not present

## 2016-03-27 DIAGNOSIS — Z1231 Encounter for screening mammogram for malignant neoplasm of breast: Secondary | ICD-10-CM | POA: Insufficient documentation

## 2016-04-11 ENCOUNTER — Ambulatory Visit (INDEPENDENT_AMBULATORY_CARE_PROVIDER_SITE_OTHER): Payer: Medicare Other | Admitting: Gastroenterology

## 2016-04-11 ENCOUNTER — Encounter: Payer: Self-pay | Admitting: Gastroenterology

## 2016-04-11 ENCOUNTER — Telehealth: Payer: Self-pay

## 2016-04-11 ENCOUNTER — Other Ambulatory Visit: Payer: Self-pay

## 2016-04-11 DIAGNOSIS — R0989 Other specified symptoms and signs involving the circulatory and respiratory systems: Secondary | ICD-10-CM

## 2016-04-11 DIAGNOSIS — F458 Other somatoform disorders: Secondary | ICD-10-CM | POA: Diagnosis not present

## 2016-04-11 DIAGNOSIS — R09A2 Foreign body sensation, throat: Secondary | ICD-10-CM | POA: Insufficient documentation

## 2016-04-11 DIAGNOSIS — K219 Gastro-esophageal reflux disease without esophagitis: Secondary | ICD-10-CM | POA: Diagnosis not present

## 2016-04-11 NOTE — Assessment & Plan Note (Signed)
70 year old female with 6-7 month history of persistent hoarseness, 2 months of throat burning and globus. ENT evaluation as outlined above. No significant noted regurgitation per patient. No typical heartburn. She has concerns about chronic PPI therapy. She has never been on PPI. No improvement of symptoms with H2 blocker which is not surprising. Discussed potential EGD for further evaluation with patient. She will like to have diagnostic study given chronicity of her symptoms. Plan on EGD plus or minus ED with Dr. [name], deep sedation plan given patient's report of inadequate conscious sedation.  I have discussed the risks, alternatives, benefits with regards to but not limited to the risk of reaction to medication, bleeding, infection, perforation and the patient is agreeable to proceed. Written consent to be obtained.

## 2016-04-11 NOTE — Progress Notes (Signed)
Primary Care Physician:  Glo Herring., MD  Primary Gastroenterologist:  Garfield Cornea, MD   Chief Complaint  Patient presents with  . Hoarse  . lump in throat    HPI:  Robin Preston is a 70 y.o. female here for further evaluation of chronic hoarseness, lump in throat. Patient states symptoms began after she had an upper respiratory infection back in March. Hoarseness began would never go away. She saw Dr. Benjamine Mola and states that he performed laryngoscopy and she had inflammation of the vocal cords but no polyps. She states she was treated for possible candidiasis. She saw him again 6 weeks later and at that time she developed burning in her throat. Repeat laryngoscopy showed improved inflammation but it was persistent. He started her on ranitidine at bedtime at that point. She has completed 4 weeks of medication with no improvement in her symptoms. She recalls having one single episode of regurgitation. She denies typical heartburn. She feels like she has a lump in her throat all the time, causes her to continually try to clear it. Denies postnasal drip or need to blow her nose. Because of the hoarseness she's not able to sing in the choir. She has eliminated caffeine in her diet. Denies associated abdominal pain. Bowel movements regular. No blood in the stool or melena.  She is due for colonoscopy next year for history tubular adenoma removed from her colon. She's not sure she wants to pursue colonoscopy at this point. She also recalls that she was not adequately sedated for the study.    Current Outpatient Prescriptions  Medication Sig Dispense Refill  . calcium-vitamin D 250-100 MG-UNIT tablet Take 1 tablet by mouth 2 (two) times daily.    Marland Kitchen estrogens, conjugated, (PREMARIN) 0.625 MG tablet Take 0.625 mg by mouth every other day.     . folic acid (FOLVITE) 1 MG tablet Take 1 mg by mouth daily.    . methimazole (TAPAZOLE) 10 MG tablet Take 10 mg by mouth every morning.      No current  facility-administered medications for this visit.    Facility-Administered Medications Ordered in Other Visits  Medication Dose Route Frequency Provider Last Rate Last Dose  . lidocaine hcl (ophth) (AKTEN) 3.5 % ophthalmic gel 1 application  1 application Right Eye Once Tonny Branch, MD        Allergies as of 04/11/2016 - Review Complete 04/11/2016  Allergen Reaction Noted  . Sulfa antibiotics Rash 09/21/2011    Past Medical History:  Diagnosis Date  . Degenerative joint disease    Hands; Rx -parenteral steroids  . Dizziness 12/2011   Onset in 12/2011  . Dyspnea   . Sinus bradycardia   . Thyroid disease     Past Surgical History:  Procedure Laterality Date  . ABDOMINAL HYSTERECTOMY    . CATARACT EXTRACTION W/PHACO Right 10/04/2012   Procedure: CATARACT EXTRACTION PHACO AND INTRAOCULAR LENS PLACEMENT (IOC);  Surgeon: Tonny Branch, MD;  Location: [facility];  Service: Ophthalmology;  Laterality: Right;  CDE:  15.78  . COLONOSCOPY W/ POLYPECTOMY  01/2010   internal hemorrhoids;9 mm rectal tubular adenoma. next tcs 2018. INADEQUATE CONSCIOUS SEDATION PER PATIENT  . FINGER ARTHRODESIS Right 06/06/2014   Procedure: ARTHRODESIS RIGHT THUMB INTRAPHALANGEAL JOINT;  Surgeon: Leanora Cover, MD;  Location: [place];  Service: Orthopedics;  Laterality: Right;  . IRRIGATION AND DEBRIDEMENT ABSCESS Left 05/02/2014   Procedure: MINOR INCISION AND DRAINAGE OF LEFT RING FINGER ABSCESS;  Surgeon: Leanora Cover, MD;  Location: [facility]  [place];  Service: Orthopedics;  Laterality: Left;  I & D LEFT RING FINGER  . 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 SKIN CANCER EXCISION     Nose    Family History  Problem Relation Age of Onset  . Non-Hodgkin's lymphoma Sister   . Lung cancer Brother   . Colon cancer Neg Hx     Social History   Social History  . Marital status: Divorced    Spouse name: N/A  . Number of children: N/A  . Years of education: N/A   Occupational  History  . Not on file.   Social History Main Topics  . Smoking status: Never Smoker  . Smokeless tobacco: Never Used  . Alcohol use No  . Drug use: No  . Sexual activity: Not on file   Other Topics Concern  . Not on file   Social History Narrative  . No narrative on file      ROS:  General: Negative for anorexia, weight loss, fever, chills, fatigue, weakness. Eyes: Negative for vision changes.  ENT: Negative for   difficulty swallowing , nasal congestion.see hpi CV: Negative for chest pain, angina, palpitations, dyspnea on exertion, peripheral edema.  Respiratory: Negative for dyspnea at rest, dyspnea on exertion, cough, sputum, wheezing.  GI: See history of present illness. GU:  Negative for dysuria, hematuria, urinary incontinence, urinary frequency, nocturnal urination.  MS: Negative for joint pain, low back pain.  Derm: Negative for rash or itching.  Neuro: Negative for weakness, abnormal sensation, seizure, frequent headaches, memory loss, confusion.  Psych: Negative for anxiety, depression, suicidal ideation, hallucinations.  Endo: Negative for unusual weight change.  Heme: Negative for bruising or bleeding. Allergy: Negative for rash or hives.    Physical Examination:  BP 121/71   Pulse (!) 56   Temp 97.9 F (36.6 C) (Oral)   Ht 5' 1.5" (1.562 m)   Wt 100 lb 3.2 oz (45.5 kg)   BMI 18.63 kg/m    General: Well-nourished, well-developed in no acute distress.  Head: Normocephalic, atraumatic.   Eyes: Conjunctiva pink, no icterus. Mouth: Oropharyngeal mucosa moist and pink , no lesions erythema or exudate. Neck: Supple without thyromegaly, masses, or lymphadenopathy.  Lungs: Clear to auscultation bilaterally.  Heart: Regular rate and rhythm, no murmurs rubs or gallops.  Abdomen: Bowel sounds are normal, nontender, nondistended, no hepatosplenomegaly or masses, no abdominal bruits or    hernia , no rebound or guarding.   Rectal: not performed Extremities: No  lower extremity edema. No clubbing or deformities.  Neuro: Alert and oriented x 4 , grossly normal neurologically.  Skin: Warm and dry, no rash or jaundice.   Psych: Alert and cooperative, normal mood and affect.    Imaging Studies: Mm Screening Breast Tomo Bilateral  Result Date: 03/31/2016 CLINICAL DATA:  Screening. EXAM: 2D DIGITAL SCREENING BILATERAL MAMMOGRAM WITH CAD AND ADJUNCT TOMO COMPARISON:  Previous exam(s). ACR Breast Density Category b: There are scattered areas of fibroglandular density. FINDINGS: There are no findings suspicious for malignancy. Images were processed with CAD. IMPRESSION: No mammographic evidence of malignancy. A result letter of this screening mammogram will be mailed directly to the patient. RECOMMENDATION: Screening mammogram in one year. (Code:SM-B-01Y) BI-RADS CATEGORY  1: Negative. Electronically Signed   By: Curlene Dolphin M.D.   On: 03/31/2016 16:11

## 2016-04-11 NOTE — Progress Notes (Signed)
cc'ed to pcp °

## 2016-04-11 NOTE — Patient Instructions (Signed)
1. Upper endoscopy as scheduled. See separate instructions.  

## 2016-04-11 NOTE — Telephone Encounter (Signed)
LMOM and informed pt of pre-op appt 04/17/16 at 1:45 pm. Letter also to be mailed.

## 2016-04-15 NOTE — Patient Instructions (Signed)
Robin Preston  04/15/2016     @PREFPERIOPPHARMACY @   Your procedure is scheduled on  04/21/2016   Report to [facility] at  645  A.M.  Call this number if you have problems the morning of surgery:  [phone]   Remember:  Do not eat food or drink liquids after midnight.  Take these medicines the morning of surgery with A SIP OF WATER  Norco, tapazole.   Do not wear jewelry, make-up or nail polish.  Do not wear lotions, powders, or perfumes, or deoderant.  Do not shave 48 hours prior to surgery.  Men may shave face and neck.  Do not bring valuables to the hospital.  [facility] is not responsible for any belongings or valuables.  Contacts, dentures or bridgework may not be worn into surgery.  Leave your suitcase in the car.  After surgery it may be brought to your room.  For patients admitted to the hospital, discharge time will be determined by your treatment team.  Patients discharged the day of surgery will not be allowed to drive home.   Name and phone number of your driver:   family Special instructions:  Follow the diet instructions given to you by Dr Roseanne Kaufman office.  Please read over the following fact sheets that you were given. Anesthesia Post-op Instructions and Care and Recovery After Surgery      Esophagogastroduodenoscopy Esophagogastroduodenoscopy (EGD) is a procedure that is used to examine the lining of the esophagus, stomach, and first part of the small intestine (duodenum). A long, flexible, lighted tube with a camera attached (endoscope) is inserted down the throat to view these organs. This procedure is done to detect problems or abnormalities, such as inflammation, bleeding, ulcers, or growths, in order to treat them. The procedure lasts 5-20 minutes. It is usually an outpatient procedure, but it may need to be performed in a hospital in emergency cases. LET [facility] CARE PROVIDER KNOW ABOUT:  Any allergies you have.  All  medicines you are taking, including vitamins, herbs, eye drops, creams, and over-the-counter medicines.  Previous problems you or members of your family have had with the use of anesthetics.  Any blood disorders you have.  Previous surgeries you have had.  Medical conditions you have. RISKS AND COMPLICATIONS Generally, this is a safe procedure. However, problems can occur and include:  Infection.  Bleeding.  Tearing (perforation) of the esophagus, stomach, or duodenum.  Difficulty breathing or not being able to breathe.  Excessive sweating.  Spasms of the larynx.  Slowed heartbeat.  Low blood pressure. BEFORE THE PROCEDURE  Do not eat or drink anything after midnight on the night before the procedure or as directed by your health care provider.  Do not take your regular medicines before the procedure if your health care provider asks you not to. Ask your health care provider about changing or stopping those medicines.  If you wear dentures, be prepared to remove them before the procedure.  Arrange for someone to drive you home after the procedure. PROCEDURE  A numbing medicine (local anesthetic) may be sprayed in your throat for comfort and to stop you from gagging or coughing.  You will have an IV tube inserted in a vein in your hand or arm. You will receive medicines and fluids through this tube.  You will be given a medicine to relax you (sedative).  A pain reliever will be given through the  IV tube.  A mouth guard may be placed in your mouth to protect your teeth and to keep you from biting on the endoscope.  You will be asked to lie on your left side.  The endoscope will be inserted down your throat and into your esophagus, stomach, and duodenum.  Air will be put through the endoscope to allow your health care provider to clearly view the lining of your esophagus.  The lining of your esophagus, stomach, and duodenum will be examined. During the exam, your  health care provider may:  Remove tissue to be examined under a microscope (biopsy) for inflammation, infection, or other medical problems.  Remove growths.  Remove objects (foreign bodies) that are stuck.  Treat any bleeding with medicines or other devices that stop tissues from bleeding (hot cautery, clipping devices).  Widen (dilate) or stretch narrowed areas of your esophagus and stomach.  The endoscope will be withdrawn. AFTER THE PROCEDURE  You will be taken to a recovery area for observation. Your blood pressure, heart rate, breathing rate, and blood oxygen level will be monitored often until the medicines you were given have worn off.  Do not eat or drink anything until the numbing medicine has worn off and your gag reflex has returned. You may choke.  Your health care provider should be able to discuss his or her findings with you. It will take longer to discuss the test results if any biopsies were taken.   This information is not intended to replace advice given to you by your health care provider. Make sure you discuss any questions you have with your health care provider.   Document Released: 10/10/2004 Document Revised: 06/30/2014 Document Reviewed: 05/12/2012 Elsevier Interactive Patient Education 2016 [place]. Esophagogastroduodenoscopy, Care After Refer to this sheet in the next few weeks. These instructions provide you with information about caring for yourself after your procedure. Your health care provider may also give you more specific instructions. Your treatment has been planned according to current medical practices, but problems sometimes occur. Call your health care provider if you have any problems or questions after your procedure. WHAT TO EXPECT AFTER THE PROCEDURE After your procedure, it is typical to feel:  Soreness in your throat.  Pain with swallowing.  Sick to your stomach (nauseous).  Bloated.  Dizzy.  Fatigued. HOME CARE  INSTRUCTIONS  Do not eat or drink anything until the numbing medicine (local anesthetic) has worn off and your gag reflex has returned. You will know that the local anesthetic has worn off when you can swallow comfortably.  Do not drive or operate machinery until directed by your health care provider.  Take medicines only as directed by your health care provider. SEEK MEDICAL CARE IF:   You cannot stop coughing.  You are not urinating at all or less than usual. SEEK IMMEDIATE MEDICAL CARE IF:  You have difficulty swallowing.  You cannot eat or drink.  You have worsening throat or chest pain.  You have dizziness or lightheadedness or you faint.  You have nausea or vomiting.  You have chills.  You have a fever.  You have severe abdominal pain.  You have black, tarry, or bloody stools.   This information is not intended to replace advice given to you by your health care provider. Make sure you discuss any questions you have with your health care provider.   Document Released: 05/26/2012 Document Revised: 06/30/2014 Document Reviewed: 05/26/2012 Elsevier Interactive Patient Education 2016 [employer]  Esophageal Dilatation  Esophageal dilatation is a procedure to open a blocked or narrowed part of the esophagus. The esophagus is the long tube in your throat that carries food and liquid from your mouth to your stomach. The procedure is also called esophageal dilation.  You may need this procedure if you have a buildup of scar tissue in your esophagus that makes it difficult, painful, or even impossible to swallow. This can be caused by gastroesophageal reflux disease (GERD). In rare cases, people need this procedure because they have cancer of the esophagus or a problem with the way food moves through the esophagus. Sometimes you may need to have another dilatation to enlarge the opening of the esophagus gradually. LET [facility] CARE PROVIDER KNOW ABOUT:   Any allergies you  have.  All medicines you are taking, including vitamins, herbs, eye drops, creams, and over-the-counter medicines.  Previous problems you or members of your family have had with the use of anesthetics.  Any blood disorders you have.  Previous surgeries you have had.  Medical conditions you have.  Any antibiotic medicines you are required to take before dental procedures. RISKS AND COMPLICATIONS Generally, this is a safe procedure. However, problems can occur and include:  Bleeding from a tear in the lining of the esophagus.  A hole (perforation) in the esophagus. BEFORE THE PROCEDURE  Do not eat or drink anything after midnight on the night before the procedure or as directed by your health care provider.  Ask your health care provider about changing or stopping your regular medicines. This is especially important if you are taking diabetes medicines or blood thinners.  Plan to have someone take you home after the procedure. PROCEDURE   You will be given a medicine that makes you relaxed and sleepy (sedative).  A medicine may be sprayed or gargled to numb the back of the throat.  Your health care provider can use various instruments to do an esophageal dilatation. During the procedure, the instrument used will be placed in your mouth and passed down into your esophagus. Options include:  Simple dilators. This instrument is carefully placed in the esophagus to stretch it.  Guided wire bougies. In this method, a flexible tube (endoscope) is used to insert a wire into the esophagus. The dilator is passed over this wire to enlarge the esophagus. Then the wire is removed.  Balloon dilators. An endoscope with a small balloon at the end is passed down into the esophagus. Inflating the balloon gently stretches the esophagus and opens it up. AFTER THE PROCEDURE  Your blood pressure, heart rate, breathing rate, and blood oxygen level will be monitored often until the medicines you were  given have worn off.  Your throat may feel slightly sore and will probably still feel numb. This will improve slowly over time.  You will not be allowed to eat or drink until the throat numbness has resolved.  If this is a same-day procedure, you may be allowed to go home once you have been able to drink, urinate, and sit on the edge of the bed without nausea or dizziness.  If this is a same-day procedure, you should have a friend or family member with you for the next 24 hours after the procedure.   This information is not intended to replace advice given to you by your health care provider. Make sure you discuss any questions you have with your health care provider.   Document Released: 07/31/2005 Document Revised: 06/30/2014 Document Reviewed: 10/19/2013 Elsevier Interactive  Patient Education 2016 [place] Monitored anesthesia care is an anesthesia service for a medical procedure. Anesthesia is the loss of the ability to feel pain. It is produced by medicines called anesthetics. It may affect a small area of your body (local anesthesia), a large area of your body (regional anesthesia), or your entire body (general anesthesia). The need for monitored anesthesia care depends your procedure, your condition, and the potential need for regional or general anesthesia. It is often provided during procedures where:   General anesthesia may be needed if there are complications. This is because you need special care when you are under general anesthesia.   You will be under local or regional anesthesia. This is so that you are able to have higher levels of anesthesia if needed.   You will receive calming medicines (sedatives). This is especially the case if sedatives are given to put you in a semi-conscious state of relaxation (deep sedation). This is because the amount of sedative needed to produce this state can be hard to predict. Too much of a sedative can produce  general anesthesia. Monitored anesthesia care is performed by one or more health care providers who have special training in all types of anesthesia. You will need to meet with these health care providers before your procedure. During this meeting, they will ask you about your medical history. They will also give you instructions to follow. (For example, you will need to stop eating and drinking before your procedure. You may also need to stop or change medicines you are taking.) During your procedure, your health care providers will stay with you. They will:   Watch your condition. This includes watching your blood pressure, breathing, and level of pain.   Diagnose and treat problems that occur.   Give medicines if they are needed. These may include calming medicines (sedatives) and anesthetics.   Make sure you are comfortable.  Having monitored anesthesia care does not necessarily mean that you will be under anesthesia. It does mean that your health care providers will be able to manage anesthesia if you need it or if it occurs. It also means that you will be able to have a different type of anesthesia than you are having if you need it. When your procedure is complete, your health care providers will continue to watch your condition. They will make sure any medicines wear off before you are allowed to go home.    This information is not intended to replace advice given to you by your health care provider. Make sure you discuss any questions you have with your health care provider.   Document Released: 03/05/2005 Document Revised: 06/30/2014 Document Reviewed: 07/21/2012 Elsevier Interactive Patient Education 2016 [employer] Inc[employer] PATIENT INSTRUCTIONS POST-ANESTHESIA  IMMEDIATELY FOLLOWING SURGERY:  Do not drive or operate machinery for the first twenty four hours after surgery.  Do not make any important decisions for twenty four hours after surgery or while taking narcotic pain medications  or sedatives.  If you develop intractable nausea and vomiting or a severe headache please notify your doctor immediately.  FOLLOW-UP:  Please make an appointment with your surgeon as instructed. You do not need to follow up with anesthesia unless specifically instructed to do so.  WOUND CARE INSTRUCTIONS (if applicable):  Keep a dry clean dressing on the anesthesia/puncture wound site if there is drainage.  Once the wound has quit draining you may leave it open to air.  Generally you should leave the bandage  intact for twenty four hours unless there is drainage.  If the epidural site drains for more than 36-48 hours please call the anesthesia department.  QUESTIONS?:  Please feel free to call your physician or the hospital operator if you have any questions, and they will be happy to assist you.

## 2016-04-17 ENCOUNTER — Encounter (HOSPITAL_COMMUNITY): Payer: Self-pay

## 2016-04-17 ENCOUNTER — Encounter (HOSPITAL_COMMUNITY)
Admission: RE | Admit: 2016-04-17 | Discharge: 2016-04-17 | Disposition: A | Discharge status: Home / Self Care | Payer: Medicare Other | Source: Ambulatory Visit | Attending: Internal Medicine | Admitting: Internal Medicine

## 2016-04-17 DIAGNOSIS — Z01810 Encounter for preprocedural cardiovascular examination: Secondary | ICD-10-CM | POA: Diagnosis not present

## 2016-04-17 DIAGNOSIS — Z01812 Encounter for preprocedural laboratory examination: Secondary | ICD-10-CM | POA: Insufficient documentation

## 2016-04-17 LAB — CBC WITH DIFFERENTIAL/PLATELET
Basophils Absolute: 0 10*3/uL (ref 0.0–0.1)
Basophils Relative: 0 %
EOS ABS: 0 10*3/uL (ref 0.0–0.7)
Eosinophils Relative: 1 %
HEMATOCRIT: 35.8 % — AB (ref 36.0–46.0)
HEMOGLOBIN: 11.8 g/dL — AB (ref 12.0–15.0)
LYMPHS ABS: 1.6 10*3/uL (ref 0.7–4.0)
Lymphocytes Relative: 34 %
MCH: 29.8 pg (ref 26.0–34.0)
MCHC: 33 g/dL (ref 30.0–36.0)
MCV: 90.4 fL (ref 78.0–100.0)
MONO ABS: 0.4 10*3/uL (ref 0.1–1.0)
MONOS PCT: 8 %
NEUTROS PCT: 57 %
Neutro Abs: 2.7 10*3/uL (ref 1.7–7.7)
Platelets: 225 10*3/uL (ref 150–400)
RBC: 3.96 MIL/uL (ref 3.87–5.11)
RDW: 12.3 % (ref 11.5–15.5)
WBC: 4.7 10*3/uL (ref 4.0–10.5)

## 2016-04-17 LAB — BASIC METABOLIC PANEL
Anion gap: 6 (ref 5–15)
BUN: 14 mg/dL (ref 6–20)
CHLORIDE: 102 mmol/L (ref 101–111)
CO2: 24 mmol/L (ref 22–32)
CREATININE: 0.95 mg/dL (ref 0.44–1.00)
Calcium: 8.9 mg/dL (ref 8.9–10.3)
GFR calc Af Amer: >60 mL/min (ref >60)
GFR calc non Af Amer: 59 mL/min — ABNORMAL LOW (ref >60)
Glucose, Bld: 114 mg/dL — ABNORMAL HIGH (ref 65–99)
Potassium: 4.2 mmol/L (ref 3.5–5.1)
Sodium: 132 mmol/L — ABNORMAL LOW (ref 135–145)

## 2016-04-21 ENCOUNTER — Encounter (HOSPITAL_COMMUNITY): Payer: Self-pay | Admitting: *Deleted

## 2016-04-21 ENCOUNTER — Ambulatory Visit (HOSPITAL_COMMUNITY): Payer: Medicare Other | Admitting: Anesthesiology

## 2016-04-21 ENCOUNTER — Encounter (HOSPITAL_COMMUNITY)
Admission: RE | Disposition: A | Discharge status: Home / Self Care | Payer: Self-pay | Source: Ambulatory Visit | Attending: Internal Medicine

## 2016-04-21 ENCOUNTER — Ambulatory Visit (HOSPITAL_COMMUNITY)
Admission: RE | Admit: 2016-04-21 | Discharge: 2016-04-21 | Disposition: A | Discharge status: Home / Self Care | Payer: Medicare Other | Source: Ambulatory Visit | Attending: Internal Medicine | Admitting: Internal Medicine

## 2016-04-21 DIAGNOSIS — J383 Other diseases of vocal cords: Secondary | ICD-10-CM | POA: Insufficient documentation

## 2016-04-21 DIAGNOSIS — Z9841 Cataract extraction status, right eye: Secondary | ICD-10-CM | POA: Insufficient documentation

## 2016-04-21 DIAGNOSIS — M19042 Primary osteoarthritis, left hand: Secondary | ICD-10-CM | POA: Insufficient documentation

## 2016-04-21 DIAGNOSIS — R0989 Other specified symptoms and signs involving the circulatory and respiratory systems: Secondary | ICD-10-CM

## 2016-04-21 DIAGNOSIS — Z882 Allergy status to sulfonamides status: Secondary | ICD-10-CM | POA: Insufficient documentation

## 2016-04-21 DIAGNOSIS — F458 Other somatoform disorders: Secondary | ICD-10-CM | POA: Diagnosis not present

## 2016-04-21 DIAGNOSIS — E079 Disorder of thyroid, unspecified: Secondary | ICD-10-CM | POA: Insufficient documentation

## 2016-04-21 DIAGNOSIS — R1310 Dysphagia, unspecified: Secondary | ICD-10-CM | POA: Insufficient documentation

## 2016-04-21 DIAGNOSIS — Z79899 Other long term (current) drug therapy: Secondary | ICD-10-CM | POA: Insufficient documentation

## 2016-04-21 DIAGNOSIS — Z801 Family history of malignant neoplasm of trachea, bronchus and lung: Secondary | ICD-10-CM | POA: Insufficient documentation

## 2016-04-21 DIAGNOSIS — Z86010 Personal history of colonic polyps: Secondary | ICD-10-CM | POA: Diagnosis not present

## 2016-04-21 DIAGNOSIS — Z807 Family history of other malignant neoplasms of lymphoid, hematopoietic and related tissues: Secondary | ICD-10-CM | POA: Diagnosis not present

## 2016-04-21 DIAGNOSIS — K219 Gastro-esophageal reflux disease without esophagitis: Secondary | ICD-10-CM | POA: Insufficient documentation

## 2016-04-21 DIAGNOSIS — Z79810 Long term (current) use of selective estrogen receptor modulators (SERMs): Secondary | ICD-10-CM | POA: Diagnosis not present

## 2016-04-21 DIAGNOSIS — M19041 Primary osteoarthritis, right hand: Secondary | ICD-10-CM | POA: Insufficient documentation

## 2016-04-21 DIAGNOSIS — K648 Other hemorrhoids: Secondary | ICD-10-CM | POA: Insufficient documentation

## 2016-04-21 DIAGNOSIS — R490 Dysphonia: Secondary | ICD-10-CM | POA: Insufficient documentation

## 2016-04-21 DIAGNOSIS — Z90710 Acquired absence of both cervix and uterus: Secondary | ICD-10-CM | POA: Diagnosis not present

## 2016-04-21 HISTORY — PX: ESOPHAGOGASTRODUODENOSCOPY (EGD) WITH PROPOFOL: SHX5813

## 2016-04-21 HISTORY — PX: MALONEY DILATION: SHX5535

## 2016-04-21 SURGERY — ESOPHAGOGASTRODUODENOSCOPY (EGD) WITH PROPOFOL
Anesthesia: Monitor Anesthesia Care

## 2016-04-21 MED ORDER — ATROPINE SULFATE 1 MG/ML IJ SOLN
INTRAMUSCULAR | Status: AC
  Filled 2016-04-21: qty 1

## 2016-04-21 MED ORDER — MIDAZOLAM HCL 2 MG/2ML IJ SOLN
1.0000 mg | INTRAMUSCULAR | Status: DC | PRN
Start: 2016-04-21 — End: 2016-04-21
  Administered 2016-04-21 (×2): 1 mg via INTRAVENOUS

## 2016-04-21 MED ORDER — ATROPINE SULFATE 0.4 MG/ML IJ SOLN
INTRAMUSCULAR | Status: AC
  Filled 2016-04-21: qty 1

## 2016-04-21 MED ORDER — LIDOCAINE VISCOUS 2 % MT SOLN
OROMUCOSAL | Status: AC
  Filled 2016-04-21: qty 15

## 2016-04-21 MED ORDER — LACTATED RINGERS IV SOLN
INTRAVENOUS | Status: DC
  Administered 2016-04-21: 08:00:00 via INTRAVENOUS

## 2016-04-21 MED ORDER — ATROPINE SULFATE 0.4 MG/ML IJ SOLN
0.4000 mg | Freq: Once | INTRAMUSCULAR | Status: AC | PRN
  Administered 2016-04-21: 0.4 mg via INTRAVENOUS
  Filled 2016-04-21: qty 1

## 2016-04-21 MED ORDER — MIDAZOLAM HCL 2 MG/2ML IJ SOLN
INTRAMUSCULAR | Status: AC
  Filled 2016-04-21: qty 2

## 2016-04-21 MED ORDER — FENTANYL CITRATE (PF) 100 MCG/2ML IJ SOLN
INTRAMUSCULAR | Status: AC
  Filled 2016-04-21: qty 2

## 2016-04-21 MED ORDER — LIDOCAINE VISCOUS 2 % MT SOLN
5.0000 mL | Freq: Once | OROMUCOSAL | Status: AC
  Administered 2016-04-21: 5 mL via OROMUCOSAL

## 2016-04-21 MED ORDER — PROPOFOL 10 MG/ML IV BOLUS
INTRAVENOUS | Status: AC
  Filled 2016-04-21: qty 20

## 2016-04-21 MED ORDER — CHLORHEXIDINE GLUCONATE CLOTH 2 % EX PADS: 6.0000 | MEDICATED_PAD | Freq: Once | CUTANEOUS | Status: DC

## 2016-04-21 MED ORDER — PROPOFOL 500 MG/50ML IV EMUL
INTRAVENOUS | Status: DC | PRN
  Administered 2016-04-21: 50 ug/kg/min via INTRAVENOUS

## 2016-04-21 MED ORDER — FENTANYL CITRATE (PF) 100 MCG/2ML IJ SOLN
25.0000 ug | INTRAMUSCULAR | Status: DC | PRN
  Administered 2016-04-21: 25 ug via INTRAVENOUS

## 2016-04-21 NOTE — Discharge Instructions (Addendum)
EGD Discharge instructions Please read the instructions outlined below and refer to this sheet in the next few weeks. These discharge instructions provide you with general information on caring for yourself after you leave the hospital. Your doctor may also give you specific instructions. While your treatment has been planned according to the most current medical practices available, unavoidable complications occasionally occur. If you have any problems or questions after discharge, please call your doctor. ACTIVITY  You may resume your regular activity but move at a slower pace for the next 24 hours.   Take frequent rest periods for the next 24 hours.   Walking will help expel (get rid of) the air and reduce the bloated feeling in your abdomen.   No driving for 24 hours (because of the anesthesia (medicine) used during the test).   You may shower.   Do not sign any important legal documents or operate any machinery for 24 hours (because of the anesthesia used during the test).  NUTRITION  Drink plenty of fluids.   You may resume your normal diet.   Begin with a light meal and progress to your normal diet.   Avoid alcoholic beverages for 24 hours or as instructed by your caregiver.  MEDICATIONS  You may resume your normal medications unless your caregiver tells you otherwise.  WHAT YOU CAN EXPECT TODAY  You may experience abdominal discomfort such as a feeling of fullness or gas pains.  FOLLOW-UP  Your doctor will discuss the results of your test with you.  SEEK IMMEDIATE MEDICAL ATTENTION IF ANY OF THE FOLLOWING OCCUR:  Excessive nausea (feeling sick to your stomach) and/or vomiting.   Severe abdominal pain and distention (swelling).   Trouble swallowing.   Temperature over 101 F (37.8 C).   Rectal bleeding or vomiting of blood.    GERD information provided  Begin rabeprazole 20 mg daily  Office visit with [place] in 3 months  Use Chloraseptic Spray for any  transient sore throat you may experience   Gastroesophageal Reflux Disease, Adult Normally, food travels down the esophagus and stays in the stomach to be digested. However, when a person has gastroesophageal reflux disease (GERD), food and stomach acid move back up into the esophagus. When this happens, the esophagus becomes sore and inflamed. Over time, GERD can create small holes (ulcers) in the lining of the esophagus.  CAUSES This condition is caused by a problem with the muscle between the esophagus and the stomach (lower esophageal sphincter, or LES). Normally, the LES muscle closes after food passes through the esophagus to the stomach. When the LES is weakened or abnormal, it does not close properly, and that allows food and stomach acid to go back up into the esophagus. The LES can be weakened by certain dietary substances, medicines, and medical conditions, including:  Tobacco use.  Pregnancy.  Having a hiatal hernia.  Heavy alcohol use.  Certain foods and beverages, such as coffee, chocolate, onions, and peppermint. RISK FACTORS This condition is more likely to develop in:  People who have an increased body weight.  People who have connective tissue disorders.  People who use NSAID medicines. SYMPTOMS Symptoms of this condition include:  Heartburn.  Difficult or painful swallowing.  The feeling of having a lump in the throat.  Abitter taste in the mouth.  Bad breath.  Having a large amount of saliva.  Having an upset or bloated stomach.  Belching.  Chest pain.  Shortness of breath or wheezing.  Ongoing (chronic) cough  or a night-time cough.  Wearing away of tooth enamel.  Weight loss. Different conditions can cause chest pain. Make sure to see your health care provider if you experience chest pain. DIAGNOSIS Your health care provider will take a medical history and perform a physical exam. To determine if you have mild or severe GERD, your health  care provider may also monitor how you respond to treatment. You may also have other tests, including:  An endoscopy toexamine your stomach and esophagus with a small camera.  A test thatmeasures the acidity level in your esophagus.  A test thatmeasures how much pressure is on your esophagus.  A barium swallow or modified barium swallow to show the shape, size, and functioning of your esophagus. TREATMENT The goal of treatment is to help relieve your symptoms and to prevent complications. Treatment for this condition may vary depending on how severe your symptoms are. Your health care provider may recommend:  Changes to your diet.  Medicine.  Surgery. HOME CARE INSTRUCTIONS Diet  Follow a diet as recommended by your health care provider. This may involve avoiding foods and drinks such as:  Coffee and tea (with or without caffeine).  Drinks that containalcohol.  Energy drinks and sports drinks.  Carbonated drinks or sodas.  Chocolate and cocoa.  Peppermint and mint flavorings.  Garlic and onions.  Horseradish.  Spicy and acidic foods, including peppers, chili powder, curry powder, vinegar, hot sauces, and barbecue sauce.  Citrus fruit juices and citrus fruits, such as oranges, lemons, and limes.  Tomato-based foods, such as red sauce, chili, salsa, and pizza with red sauce.  Fried and fatty foods, such as donuts, french fries, potato chips, and high-fat dressings.  High-fat meats, such as hot dogs and fatty cuts of red and white meats, such as rib eye steak, sausage, ham, and bacon.  High-fat dairy items, such as whole milk, butter, and cream cheese.  Eat small, frequent meals instead of large meals.  Avoid drinking large amounts of liquid with your meals.  Avoid eating meals during the 2-3 hours before bedtime.  Avoid lying down right after you eat.  Do not exercise right after you eat. General Instructions  Pay attention to any changes in your  symptoms.  Take over-the-counter and prescription medicines only as told by your health care provider. Do not take aspirin, ibuprofen, or other NSAIDs unless your health care provider told you to do so.  Do not use any tobacco products, including cigarettes, chewing tobacco, and e-cigarettes. If you need help quitting, ask your health care provider.  Wear loose-fitting clothing. Do not wear anything tight around your waist that causes pressure on your abdomen.  Raise (elevate) the head of your bed 6 inches (15cm).  Try to reduce your stress, such as with yoga or meditation. If you need help reducing stress, ask your health care provider.  If you are overweight, reduce your weight to an amount that is healthy for you. Ask your health care provider for guidance about a safe weight loss goal.  Keep all follow-up visits as told by your health care provider. This is important. SEEK MEDICAL CARE IF:  You have new symptoms.  You have unexplained weight loss.  You have difficulty swallowing, or it hurts to swallow.  You have wheezing or a persistent cough.  Your symptoms do not improve with treatment.  You have a hoarse voice. SEEK IMMEDIATE MEDICAL CARE IF:  You have pain in your arms, neck, jaw, teeth, or back.  You feel sweaty, dizzy, or light-headed.  You have chest pain or shortness of breath.  You vomit and your vomit looks like blood or coffee grounds.  You faint.  Your stool is bloody or black.  You cannot swallow, drink, or eat.   This information is not intended to replace advice given to you by your health care provider. Make sure you discuss any questions you have with your health care provider.   Document Released: 03/19/2005 Document Revised: 02/28/2015 Document Reviewed: 10/04/2014 Elsevier Interactive Patient Education 2016 Elsevier Inc[employer]  PATIENT INSTRUCTIONS POST-ANESTHESIA  IMMEDIATELY FOLLOWING SURGERY:  Do not drive or operate machinery for the first  twenty four hours after surgery.  Do not make any important decisions for twenty four hours after surgery or while taking narcotic pain medications or sedatives.  If you develop intractable nausea and vomiting or a severe headache please notify your doctor immediately.  FOLLOW-UP:  Please make an appointment with your surgeon as instructed. You do not need to follow up with anesthesia unless specifically instructed to do so.  WOUND CARE INSTRUCTIONS (if applicable):  Keep a dry clean dressing on the anesthesia/puncture wound site if there is drainage.  Once the wound has quit draining you may leave it open to air.  Generally you should leave the bandage intact for twenty four hours unless there is drainage.  If the epidural site drains for more than 36-48 hours please call the anesthesia department.  QUESTIONS?:  Please feel free to call your physician or the hospital operator if you have any questions, and they will be happy to assist you.

## 2016-04-21 NOTE — Interval H&P Note (Signed)
History and Physical Interval Note:  04/21/2016 7:33 AM  Robin Preston  has presented today for surgery, with the diagnosis of LPR, globus  The various methods of treatment have been discussed with the patient and family. After consideration of risks, benefits and other options for treatment, the patient has consented to  Procedure(s) with comments: ESOPHAGOGASTRODUODENOSCOPY (EGD) WITH PROPOFOL (N/A) - 8:15 am MALONEY DILATION (N/A) as a surgical intervention .  The patient's history has been reviewed, patient examined, no change in status, stable for surgery.  I have reviewed the patient's chart and labs.  Questions were answered to the patient's satisfaction.     Robin Preston  No change. EGD/EGD plan.  The risks, benefits, limitations, alternatives and imponderables have been reviewed with the patient. Potential for esophageal dilation, biopsy, etc. have also been reviewed.  Questions have been answered. All parties agreeable.

## 2016-04-21 NOTE — H&P (View-Only) (Signed)
Primary Care Physician:  Glo Herring., MD  Primary Gastroenterologist:  Garfield Cornea, MD   Chief Complaint  Patient presents with  . Hoarse  . lump in throat    HPI:  Robin Preston is a 70 y.o. female here for further evaluation of chronic hoarseness, lump in throat. Patient states symptoms began after she had an upper respiratory infection back in March. Hoarseness began would never go away. She saw Dr. Benjamine Mola and states that he performed laryngoscopy and she had inflammation of the vocal cords but no polyps. She states she was treated for possible candidiasis. She saw him again 6 weeks later and at that time she developed burning in her throat. Repeat laryngoscopy showed improved inflammation but it was persistent. He started her on ranitidine at bedtime at that point. She has completed 4 weeks of medication with no improvement in her symptoms. She recalls having one single episode of regurgitation. She denies typical heartburn. She feels like she has a lump in her throat all the time, causes her to continually try to clear it. Denies postnasal drip or need to blow her nose. Because of the hoarseness she's not able to sing in the choir. She has eliminated caffeine in her diet. Denies associated abdominal pain. Bowel movements regular. No blood in the stool or melena.  She is due for colonoscopy next year for history tubular adenoma removed from her colon. She's not sure she wants to pursue colonoscopy at this point. She also recalls that she was not adequately sedated for the study.    Current Outpatient Prescriptions  Medication Sig Dispense Refill  . calcium-vitamin D 250-100 MG-UNIT tablet Take 1 tablet by mouth 2 (two) times daily.    Marland Kitchen estrogens, conjugated, (PREMARIN) 0.625 MG tablet Take 0.625 mg by mouth every other day.     . folic acid (FOLVITE) 1 MG tablet Take 1 mg by mouth daily.    . methimazole (TAPAZOLE) 10 MG tablet Take 10 mg by mouth every morning.      No current  facility-administered medications for this visit.    Facility-Administered Medications Ordered in Other Visits  Medication Dose Route Frequency Provider Last Rate Last Dose  . lidocaine hcl (ophth) (AKTEN) 3.5 % ophthalmic gel 1 application  1 application Right Eye Once Tonny Branch, MD        Allergies as of 04/11/2016 - Review Complete 04/11/2016  Allergen Reaction Noted  . Sulfa antibiotics Rash 09/21/2011    Past Medical History:  Diagnosis Date  . Degenerative joint disease    Hands; Rx -parenteral steroids  . Dizziness 12/2011   Onset in 12/2011  . Dyspnea   . Sinus bradycardia   . Thyroid disease     Past Surgical History:  Procedure Laterality Date  . ABDOMINAL HYSTERECTOMY    . CATARACT EXTRACTION W/PHACO Right 10/04/2012   Procedure: CATARACT EXTRACTION PHACO AND INTRAOCULAR LENS PLACEMENT (IOC);  Surgeon: Tonny Branch, MD;  Location: [facility];  Service: Ophthalmology;  Laterality: Right;  CDE:  15.78  . COLONOSCOPY W/ POLYPECTOMY  01/2010   internal hemorrhoids;9 mm rectal tubular adenoma. next tcs 2018. INADEQUATE CONSCIOUS SEDATION PER PATIENT  . FINGER ARTHRODESIS Right 06/06/2014   Procedure: ARTHRODESIS RIGHT THUMB INTRAPHALANGEAL JOINT;  Surgeon: Leanora Cover, MD;  Location: [facility];  Service: Orthopedics;  Laterality: Right;  . IRRIGATION AND DEBRIDEMENT ABSCESS Left 05/02/2014   Procedure: MINOR INCISION AND DRAINAGE OF LEFT RING FINGER ABSCESS;  Surgeon: Leanora Cover, MD;  Location: [facility];  Service: Orthopedics;  Laterality: Left;  I & D LEFT RING FINGER  . 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 SKIN CANCER EXCISION     Nose    Family History  Problem Relation Age of Onset  . Non-Hodgkin's lymphoma Sister   . Lung cancer Brother   . Colon cancer Neg Hx     Social History   Social History  . Marital status: Divorced    Spouse name: N/A  . Number of children: N/A  . Years of education: N/A   Occupational  History  . Not on file.   Social History Main Topics  . Smoking status: Never Smoker  . Smokeless tobacco: Never Used  . Alcohol use No  . Drug use: No  . Sexual activity: Not on file   Other Topics Concern  . Not on file   Social History Narrative  . No narrative on file      ROS:  General: Negative for anorexia, weight loss, fever, chills, fatigue, weakness. Eyes: Negative for vision changes.  ENT: Negative for   difficulty swallowing , nasal congestion.see hpi CV: Negative for chest pain, angina, palpitations, dyspnea on exertion, peripheral edema.  Respiratory: Negative for dyspnea at rest, dyspnea on exertion, cough, sputum, wheezing.  GI: See history of present illness. GU:  Negative for dysuria, hematuria, urinary incontinence, urinary frequency, nocturnal urination.  MS: Negative for joint pain, low back pain.  Derm: Negative for rash or itching.  Neuro: Negative for weakness, abnormal sensation, seizure, frequent headaches, memory loss, confusion.  Psych: Negative for anxiety, depression, suicidal ideation, hallucinations.  Endo: Negative for unusual weight change.  Heme: Negative for bruising or bleeding. Allergy: Negative for rash or hives.    Physical Examination:  BP 121/71   Pulse (!) 56   Temp 97.9 F (36.6 C) (Oral)   Ht 5' 1.5" (1.562 m)   Wt 100 lb 3.2 oz (45.5 kg)   BMI 18.63 kg/m    General: Well-nourished, well-developed in no acute distress.  Head: Normocephalic, atraumatic.   Eyes: Conjunctiva pink, no icterus. Mouth: Oropharyngeal mucosa moist and pink , no lesions erythema or exudate. Neck: Supple without thyromegaly, masses, or lymphadenopathy.  Lungs: Clear to auscultation bilaterally.  Heart: Regular rate and rhythm, no murmurs rubs or gallops.  Abdomen: Bowel sounds are normal, nontender, nondistended, no hepatosplenomegaly or masses, no abdominal bruits or    hernia , no rebound or guarding.   Rectal: not performed Extremities: No  lower extremity edema. No clubbing or deformities.  Neuro: Alert and oriented x 4 , grossly normal neurologically.  Skin: Warm and dry, no rash or jaundice.   Psych: Alert and cooperative, normal mood and affect.    Imaging Studies: Mm Screening Breast Tomo Bilateral  Result Date: 03/31/2016 CLINICAL DATA:  Screening. EXAM: 2D DIGITAL SCREENING BILATERAL MAMMOGRAM WITH CAD AND ADJUNCT TOMO COMPARISON:  Previous exam(s). ACR Breast Density Category b: There are scattered areas of fibroglandular density. FINDINGS: There are no findings suspicious for malignancy. Images were processed with CAD. IMPRESSION: No mammographic evidence of malignancy. A result letter of this screening mammogram will be mailed directly to the patient. RECOMMENDATION: Screening mammogram in one year. (Code:SM-B-01Y) BI-RADS CATEGORY  1: Negative. Electronically Signed   By: Curlene Dolphin M.D.   On: 03/31/2016 16:11

## 2016-04-21 NOTE — Anesthesia Preprocedure Evaluation (Signed)
Anesthesia Evaluation  Patient identified by MRN, date of birth, ID band Patient awake    Reviewed: Allergy & Precautions, H&P , NPO status , Patient's Chart, lab work & pertinent test results  Airway Mallampati: II  TM Distance: >3 FB Neck ROM: Full    Dental no notable dental hx. (+) Partial Upper, Dental Advisory Given   Pulmonary shortness of breath and with exertion,    breath sounds clear to auscultation       Cardiovascular negative cardio ROS  + dysrhythmias (chronic bradycardia VR=40's)  Rhythm:Regular Rate:Bradycardia     Neuro/Psych negative neurological ROS  negative psych ROS   GI/Hepatic Neg liver ROS, GERD  ,  Endo/Other  negative endocrine ROS  Renal/GU negative Renal ROS  negative genitourinary   Musculoskeletal  (+) Arthritis , Osteoarthritis,    Abdominal   Peds  Hematology negative hematology ROS (+)   Anesthesia Other Findings   Reproductive/Obstetrics negative OB ROS                             Anesthesia Physical Anesthesia Plan  ASA: II  Anesthesia Plan: MAC   Post-op Pain Management:    Induction: Intravenous  Airway Management Planned: Simple Face Mask  Additional Equipment:   Intra-op Plan:   Post-operative Plan:   Informed Consent: I have reviewed the patients History and Physical, chart, labs and discussed the procedure including the risks, benefits and alternatives for the proposed anesthesia with the patient or authorized representative who has indicated his/her understanding and acceptance.     Plan Discussed with:   Anesthesia Plan Comments:         Anesthesia Quick Evaluation

## 2016-04-21 NOTE — Transfer of Care (Signed)
Immediate Anesthesia Transfer of Care Note  Patient: Robin Preston  Procedure(s) Performed: Procedure(s) with comments: ESOPHAGOGASTRODUODENOSCOPY (EGD) WITH PROPOFOL (N/A) - 8:15 am MALONEY DILATION (N/A)  Patient Location: PACU  Anesthesia Type:MAC  Level of Consciousness: awake and patient cooperative  Airway & Oxygen Therapy: Patient Spontanous Breathing and Patient connected to face mask oxygen  Post-op Assessment: Report given to RN and Patient moving all extremities  Post vital signs: Reviewed and stable  Last Vitals:  Vitals:   04/21/16 0800 04/21/16 0815  BP:    Pulse:    Resp: (!) 22 12  Temp:      Last Pain:  Vitals:   04/21/16 0730  TempSrc: Oral      Patients Stated Pain Goal: 4 ([id number])  Complications: No apparent anesthesia complications

## 2016-04-21 NOTE — Op Note (Signed)
NAME:  Robin Preston, Robin Preston                  ACCOUNT NO.:  [id number]  MEDICAL RECORD NO.:  [id number]  LOCATION:  [facility]                          FACILITY:  [facility]  PHYSICIAN:  R. Garfield Cornea, MD [place]:  1946-04-12  DATE OF PROCEDURE:  04/21/2016 DATE OF DISCHARGE:                              OPERATIVE REPORT   PROCEDURE:  Esophagogastroduodenoscopy with Venia Minks dilation.  INDICATIONS FOR PROCEDURE:  A 70 year old lady with symptoms consistent with laryngopharyngeal reflux and esophageal dysphagia.  EGD is now being done potential for esophageal dilation reviewed.  Risks, benefits, limitations, alternatives, and imponderables have been discussed. Please see the documentation of medical record.  PROCEDURE NOTE:  Deep sedation was induced by Dr. Duwayne Heck and associates.  INSTRUMENT:  Pentax video chip system.  FINDINGS:  Examination of the tubular esophagus revealed no abnormalities.  Stomach empty.  Thorough exam of the gastric mucosa including retroflexed view of proximal stomach, esophagogastric junction demonstrated no abnormalities.  Pylorus was patent.  Exam of the bulb and second portion revealed no abnormalities.  THERAPEUTIC/DIAGNOSTIC MANEUVERS PERFORMED:  I attempted to pass a 22- [place] Maloney dilator, but I could not get it started in the hypopharynx.  Subsequently, backed off and obtained a 50-[place] Maloney dilator, passed to full insertion with only mild resistance.  A look back revealed a superficial tear through the UES mucosa, otherwise no apparent complication from this maneuver.  ESTIMATED BLOOD LOSS:  Minimal.  SERVICE TIME:  15 minutes.  The patient tolerated the procedure well, was taken to PACU in stable condition.  IMPRESSION: 1. Normal esophagogastroduodenoscopy, status post Maloney dilation as     described.  The possibility of occult cervical esophageal web is     not excluded. The patient resisted taking substantial acid suppression  therapy concerned about all the side effects in the  __________ regarding PPIs.  I explained the patient prior to the procedure that most of this data was administrative __________.  The benefits of acid suppression therapy at least over the short term before outweigh any potential risks. 1. Begin AcipHex/rabeprazole 20 mg daily. 2. Office visit with [place] in 3 months. 3. Use Chloraseptic spray for any transient sore throat you may     experience.     Bridgette Habermann, MD [facility]     RMR/MEDQ  D:  04/21/2016  T:  04/21/2016  Job:  [id number]

## 2016-04-21 NOTE — Anesthesia Postprocedure Evaluation (Signed)
Anesthesia Post Note  Patient: Robin Preston  Procedure(s) Performed: Procedure(s) (LRB): ESOPHAGOGASTRODUODENOSCOPY (EGD) WITH PROPOFOL (N/A) MALONEY DILATION (N/A)  Patient location during evaluation: PACU Anesthesia Type: MAC Level of consciousness: awake and patient cooperative Pain management: pain level controlled Vital Signs Assessment: post-procedure vital signs reviewed and stable Respiratory status: spontaneous breathing, nonlabored ventilation and respiratory function stable Cardiovascular status: blood pressure returned to baseline and bradycardic Postop Assessment: no signs of nausea or vomiting Anesthetic complications: no    Last Vitals:  Vitals:   04/21/16 0800 04/21/16 0815  BP:    Pulse:    Resp: (!) 22 12  Temp:      Last Pain:  Vitals:   04/21/16 0730  TempSrc: Oral                 Jahquan Klugh J

## 2016-04-23 ENCOUNTER — Encounter (HOSPITAL_COMMUNITY): Payer: Self-pay | Admitting: Internal Medicine

## 2016-04-24 ENCOUNTER — Telehealth: Payer: Self-pay | Admitting: Internal Medicine

## 2016-04-24 NOTE — Telephone Encounter (Signed)
Working on [place] for this.

## 2016-04-24 NOTE — Telephone Encounter (Signed)
[phone]  Please call patient regarding the meds that were prescribed after her procedure   Pharmacy stated her insurance would not cover them.

## 2016-04-25 MED ORDER — DEXLANSOPRAZOLE 60 MG PO CPDR: 60.0000 mg | DELAYED_RELEASE_CAPSULE | Freq: Every day | ORAL | 11 refills | Status: DC

## 2016-04-25 NOTE — Telephone Encounter (Signed)
Patient is aware that there is a change in her medication and that we will be sending in the new Rx.

## 2016-04-25 NOTE — Telephone Encounter (Signed)
Okay: Let's try Dexilant 60 mg once daily. Dispensed 30. 11 refills.

## 2016-04-25 NOTE — Telephone Encounter (Signed)
PA for rabeprazole was denied. Pt has to try and fail 4 of the preferred ppi's.  The preferred are : dexilant, nexium omeprazole and pantoprazole. The patient has not tried any of these. Can we change her to one of these?

## 2016-04-25 NOTE — Addendum Note (Signed)
Addended by: Claudina Lick on: 04/25/2016 10:08 AM   Modules accepted: Orders

## 2016-04-25 NOTE — Telephone Encounter (Signed)
rx sent in 

## 2016-04-28 NOTE — Progress Notes (Signed)
preop labs with mild anemia and hyponatremia.  Recent EGD unremarkable. Would recommend ifobt.  Repeat cbc,iron/tibc, ferritin in four weeks.

## 2016-04-30 NOTE — Telephone Encounter (Signed)
Pt can not afford the Dexliant and she would like to try something different. Please advise

## 2016-04-30 NOTE — Telephone Encounter (Signed)
Dr.Rourk, Dexilant is too expensive. Her other alternatives are omeprazole, pantprazole or nexium. Can we change her to one of these?

## 2016-04-30 NOTE — Telephone Encounter (Signed)
Pantoprazole 40 mg daily

## 2016-05-01 MED ORDER — PANTOPRAZOLE SODIUM 40 MG PO TBEC: 40.0000 mg | DELAYED_RELEASE_TABLET | Freq: Every day | ORAL | 11 refills | Status: DC

## 2016-05-01 NOTE — Telephone Encounter (Signed)
rx has been sent in to the pharmacy.  

## 2016-05-01 NOTE — Addendum Note (Signed)
Addended by: Claudina Lick on: 05/01/2016 03:13 PM   Modules accepted: Orders

## 2016-05-02 NOTE — Telephone Encounter (Signed)
Called pt- LMOM that rx was at the pharmacy.

## 2016-05-09 ENCOUNTER — Other Ambulatory Visit: Payer: Self-pay | Admitting: Gastroenterology

## 2016-05-09 DIAGNOSIS — D649 Anemia, unspecified: Secondary | ICD-10-CM

## 2016-05-14 ENCOUNTER — Telehealth: Payer: Self-pay | Admitting: Internal Medicine

## 2016-05-14 NOTE — Telephone Encounter (Signed)
Pt received ifobt. I spoke with her and she will bring it back Monday because she will be going to the beach with some friends next week.

## 2016-05-14 NOTE — Telephone Encounter (Signed)
Pt received her letter from [facility] about having labs done and doing a stool test. I told her if the container was the small vial it would need to come here or if it was the bigger containers it would go to the lab. She doesn't know how long it'll be good for if we aren't open Thursday or Friday. I told her that I would have the nurse call her back. She said she was going to [place] but if the nurse could call between 4-5pm she would be inside by then. Please advise and call her at [phone]

## 2016-05-20 DIAGNOSIS — F419 Anxiety disorder, unspecified: Secondary | ICD-10-CM | POA: Diagnosis not present

## 2016-05-20 DIAGNOSIS — G894 Chronic pain syndrome: Secondary | ICD-10-CM | POA: Diagnosis not present

## 2016-05-20 DIAGNOSIS — Z1389 Encounter for screening for other disorder: Secondary | ICD-10-CM | POA: Diagnosis not present

## 2016-05-20 DIAGNOSIS — Z681 Body mass index (BMI) 19 or less, adult: Secondary | ICD-10-CM | POA: Diagnosis not present

## 2016-05-20 DIAGNOSIS — M1991 Primary osteoarthritis, unspecified site: Secondary | ICD-10-CM | POA: Diagnosis not present

## 2016-05-20 DIAGNOSIS — H6121 Impacted cerumen, right ear: Secondary | ICD-10-CM | POA: Diagnosis not present

## 2016-05-20 DIAGNOSIS — K219 Gastro-esophageal reflux disease without esophagitis: Secondary | ICD-10-CM | POA: Diagnosis not present

## 2016-06-05 ENCOUNTER — Ambulatory Visit (INDEPENDENT_AMBULATORY_CARE_PROVIDER_SITE_OTHER): Payer: Medicare Other | Admitting: Advanced Practice Midwife

## 2016-06-05 ENCOUNTER — Encounter: Payer: Self-pay | Admitting: Advanced Practice Midwife

## 2016-06-05 VITALS — BP 108/58 | HR 54 | Ht 61.0 in | Wt 102.0 lb

## 2016-06-05 DIAGNOSIS — N906 Unspecified hypertrophy of vulva: Secondary | ICD-10-CM

## 2016-06-05 NOTE — Progress Notes (Signed)
[facility] Visit  Patient name: Robin Preston MRN [id number]  Date of birth: 21-Mar-1946  CC & HPI:  Robin Preston is a 70 y.o. Caucasian female presenting today for "feeling off down there". She feels like "something is rubbing in my underware when I walk",  It is not painful, "just feels off". Wants to make sure nothing is wrong.  Has had hysterectomy  Rarely leaks urine.  No pain  Feels like it is her "skin".   Pertinent History Reviewed:  Medical & Surgical Hx:   Past Medical History:  Diagnosis Date  . Degenerative joint disease    Hands; Rx -parenteral steroids  . Dizziness 12/2011   Onset in 12/2011  . Dyspnea   . Sinus bradycardia   . Thyroid disease    Past Surgical History:  Procedure Laterality Date  . ABDOMINAL HYSTERECTOMY    . CATARACT EXTRACTION W/PHACO Right 10/04/2012   Procedure: CATARACT EXTRACTION PHACO AND INTRAOCULAR LENS PLACEMENT (IOC);  Surgeon: Tonny Branch, MD;  Location: [facility];  Service: Ophthalmology;  Laterality: Right;  CDE:  15.78  . COLONOSCOPY W/ POLYPECTOMY  01/2010   internal hemorrhoids;9 mm rectal tubular adenoma. next tcs 2018. INADEQUATE CONSCIOUS SEDATION PER PATIENT  . ESOPHAGOGASTRODUODENOSCOPY (EGD) WITH PROPOFOL N/A 04/21/2016   Procedure: ESOPHAGOGASTRODUODENOSCOPY (EGD) WITH PROPOFOL;  Surgeon: Daneil Dolin, MD;  [facility];  Service: Endoscopy;  Laterality: N/A;  8:15 am  . FINGER ARTHRODESIS Right 06/06/2014   Procedure: ARTHRODESIS RIGHT THUMB INTRAPHALANGEAL JOINT;  Surgeon: Leanora Cover, MD;  Location: [facility];  Service: Orthopedics;  Laterality: Right;  . IRRIGATION AND DEBRIDEMENT ABSCESS Left 05/02/2014   Procedure: MINOR INCISION AND DRAINAGE OF LEFT RING FINGER ABSCESS;  Surgeon: Leanora Cover, MD;  Location: [facility];  Service: Orthopedics;  Laterality: Left;  I & D LEFT RING FINGER  . 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 MALONEY DILATION  N/A 04/21/2016   Procedure: Venia Minks DILATION;  Surgeon: Daneil Dolin, MD;  [facility];  Service: Endoscopy;  Laterality: N/A;  . SKIN CANCER EXCISION     Nose   Family History  Problem Relation Age of Onset  . Non-Hodgkin's lymphoma Sister   . Lung cancer Brother   . Colon cancer Neg Hx     Current Outpatient Prescriptions:  .  Biotin 10000 MCG TABS, Take 10,000 mcg by mouth daily., Disp: , Rfl:  .  Calcium Carb-Cholecalciferol (CALCIUM 500+D PO), Take 1 tablet by mouth daily., Disp: , Rfl:  .  dexlansoprazole (DEXILANT) 60 MG capsule, Take 1 capsule (60 mg total) by mouth daily., Disp: 30 capsule, Rfl: 11 .  estradiol (ESTRACE) 0.5 MG tablet, Take 0.5 mg by mouth daily., Disp: , Rfl:  .  folic acid (FOLVITE) Q000111Q MCG tablet, Take 400 mcg by mouth daily., Disp: , Rfl:  .  HYDROcodone-acetaminophen (NORCO/VICODIN) 5-325 MG tablet, Take 1 tablet by mouth daily as needed for moderate pain., Disp: , Rfl:  .  methimazole (TAPAZOLE) 10 MG tablet, Take 10 mg by mouth 2 (two) times daily. , Disp: , Rfl:  .  Misc Natural Products (TART CHERRY ADVANCED PO), Take 1 tablet by mouth daily., Disp: , Rfl:  .  Omega-3 Fatty Acids (OMEGA 3 PO), Take 1 capsule by mouth daily., Disp: , Rfl:  .  pantoprazole (PROTONIX) 40 MG tablet, Take 1 tablet (40 mg total) by mouth daily., Disp: 30 tablet, Rfl: 11 .  TURMERIC PO, Take 1 tablet by mouth daily., Disp: , Rfl:  No current facility-administered medications for this visit.   Facility-Administered Medications Ordered in Other Visits:  .  lidocaine hcl (ophth) (AKTEN) 3.5 % ophthalmic gel 1 application, 1 application, Right Eye, Once, Tonny Branch, MD Social History: Reviewed -  reports that she has never smoked. She has never used smokeless tobacco.  Review of Systems:   Constitutional: Negative for fever and chills Eyes: Negative for visual disturbances Respiratory: Negative for shortness of breath, dyspnea Cardiovascular: Negative for chest  pain or palpitations  Gastrointestinal: Negative for vomiting, diarrhea and constipation; no abdominal pain Genitourinary: Negative for dysuria and urgency, vaginal irritation or itching Musculoskeletal: arthritis, mainly in hands Neurological: Negative for dizziness and headaches    Objective Findings:    Physical Examination: General appearance - well appearing, and in no distress Mental status - alert, oriented to person, place, and time Chest:  Normal respiratory effort Heart - normal rate and regular rhythm Abdomen:  Soft, nontender Pelvic: normal appearing.  Labia minora slighly less elastic, pt states that that is the area she now feels.  Not significantly or abnormally enlarged Musculoskeletal:  Normal range of motion without pain Extremities:  No edema    No results found for this or any previous visit (from the past 24 hour(s)).    Assessment & Plan:  A:   Probably just aware of the loss of elasticity that comes with aging, now able to feel labia miniora rubbing in her undergarments.  P:  No treatment needed   No Follow-up on file.  CRESENZO-DISHMAN,Analyce Tavares CNM 06/05/2016 2:15 PM

## 2016-06-13 ENCOUNTER — Ambulatory Visit (INDEPENDENT_AMBULATORY_CARE_PROVIDER_SITE_OTHER): Payer: Medicare Other | Admitting: Gastroenterology

## 2016-06-13 ENCOUNTER — Encounter: Payer: Self-pay | Admitting: Gastroenterology

## 2016-06-13 VITALS — BP 116/61 | HR 48 | Temp 97.4°F | Ht 61.0 in | Wt 103.4 lb

## 2016-06-13 DIAGNOSIS — K219 Gastro-esophageal reflux disease without esophagitis: Secondary | ICD-10-CM | POA: Diagnosis not present

## 2016-06-13 DIAGNOSIS — D649 Anemia, unspecified: Secondary | ICD-10-CM | POA: Diagnosis not present

## 2016-06-13 LAB — CBC
HCT: 39.5 % (ref 35.0–45.0)
Hemoglobin: 12.7 g/dL (ref 11.7–15.5)
MCH: 29.4 pg (ref 27.0–33.0)
MCHC: 32.2 g/dL (ref 32.0–36.0)
MCV: 91.4 fL (ref 80.0–100.0)
MPV: 10.6 fL (ref 7.5–12.5)
PLATELETS: 246 10*3/uL (ref 140–400)
RBC: 4.32 MIL/uL (ref 3.80–5.10)
RDW: 13.7 % (ref 11.0–15.0)
WBC: 5.7 10*3/uL (ref 3.8–10.8)

## 2016-06-13 LAB — IRON AND TIBC
%SAT: 25 % (ref 11–50)
IRON: 76 ug/dL (ref 45–160)
TIBC: 301 ug/dL (ref 250–450)
UIBC: 225 ug/dL (ref 125–400)

## 2016-06-13 LAB — FERRITIN: FERRITIN: 21 ng/mL (ref 20–288)

## 2016-06-13 NOTE — Progress Notes (Signed)
Referring Provider: [name], MD Primary Care Physician:  Purvis Kilts, MD Primary GI: Dr. [name]   Chief Complaint  Patient presents with  . laryngopharyngeal reflux    burning in throat is better, still has some hoarseness    HPI:   Robin Preston is a 70 y.o. female presenting today with a history of GERD, chronic hoarseness, globus sensation. EGD with normal esophagus s/p Maloney dilation, unable to exclude occult cervical esophageal web. Has seen Dr. Benjamine Mola already and was told she had "irritation". Has had concerns about chronic PPI therapy. Needs CBC, iron/TIBC/ferritin now. Mild normocytic anemia noted on labs in October 2017. Needs ifobt. Next colonoscopy due in Aug 2018.   Burning in throat is better. Taking Protonix 40 mg once daily. No Zantac in evening. Trying to avoid eating late at night like she used to. Tries to eat things that are easy to digest. Yesterday had a severe sinus headache and now with sinus drainage today, irritating throat. Feels that overall the hoarseness is better. No problems with dysphagia. Not taking PPI on an empty stomach. Didn't complete the ifobt yet. No concerning lower GI symptoms. Does not want to pursue a colonoscopy right now.   Past Medical History:  Diagnosis Date  . Degenerative joint disease    Hands; Rx -parenteral steroids  . Dizziness 12/2011   Onset in 12/2011  . Dyspnea   . Sinus bradycardia   . Thyroid disease     Past Surgical History:  Procedure Laterality Date  . ABDOMINAL HYSTERECTOMY    . CATARACT EXTRACTION W/PHACO Right 10/04/2012   Procedure: CATARACT EXTRACTION PHACO AND INTRAOCULAR LENS PLACEMENT (IOC);  Surgeon: Tonny Branch, MD;  Location: [facility];  Service: Ophthalmology;  Laterality: Right;  CDE:  15.78  . COLONOSCOPY W/ POLYPECTOMY  01/2010   internal hemorrhoids;9 mm rectal tubular adenoma. next tcs 2018. INADEQUATE CONSCIOUS SEDATION PER PATIENT  . ESOPHAGOGASTRODUODENOSCOPY (EGD) WITH PROPOFOL N/A  04/21/2016   Dr. [name]: Normal esophagus s/p Maloney dilation, unable to exclude occult cervical esophageal web.   Marland Kitchen FINGER ARTHRODESIS Right 06/06/2014   Procedure: ARTHRODESIS RIGHT THUMB INTRAPHALANGEAL JOINT;  Surgeon: Leanora Cover, MD;  Location: [place];  Service: Orthopedics;  Laterality: Right;  . IRRIGATION AND DEBRIDEMENT ABSCESS Left 05/02/2014   Procedure: MINOR INCISION AND DRAINAGE OF LEFT RING FINGER ABSCESS;  Surgeon: Leanora Cover, MD;  Location: [place];  Service: Orthopedics;  Laterality: Left;  I & D LEFT RING FINGER  . 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 MALONEY DILATION N/A 04/21/2016   Procedure: Venia Minks DILATION;  Surgeon: Daneil Dolin, MD;  Location: [facility];  Service: Endoscopy;  Laterality: N/A;  . SKIN CANCER EXCISION     Nose    Current Outpatient Prescriptions  Medication Sig Dispense Refill  . Biotin 10000 MCG TABS Take 10,000 mcg by mouth daily.    . Calcium Carb-Cholecalciferol (CALCIUM 500+D PO) Take 1 tablet by mouth daily.    Marland Kitchen estradiol (ESTRACE) 0.5 MG tablet Take 0.5 mg by mouth daily.    . folic acid (FOLVITE) Q000111Q MCG tablet Take 400 mcg by mouth daily.    Marland Kitchen HYDROcodone-acetaminophen (NORCO/VICODIN) 5-325 MG tablet Take 1 tablet by mouth daily as needed for moderate pain.    . methimazole (TAPAZOLE) 10 MG tablet Take 10 mg by mouth 2 (two) times daily.     . Misc Natural Products (TART CHERRY ADVANCED PO)  Take 1 tablet by mouth daily.    . Omega-3 Fatty Acids (OMEGA 3 PO) Take 1 capsule by mouth daily.    . pantoprazole (PROTONIX) 40 MG tablet Take 1 tablet (40 mg total) by mouth daily. 30 tablet 11  . TURMERIC PO Take 1 tablet by mouth daily.    Marland Kitchen dexlansoprazole (DEXILANT) 60 MG capsule Take 1 capsule (60 mg total) by mouth daily. (Patient not taking: Reported on 06/13/2016) 30 capsule 11   No current facility-administered medications for this visit.    Facility-Administered Medications  Ordered in Other Visits  Medication Dose Route Frequency Provider Last Rate Last Dose  . lidocaine hcl (ophth) (AKTEN) 3.5 % ophthalmic gel 1 application  1 application Right Eye Once Tonny Branch, MD        Allergies as of 06/13/2016 - Review Complete 06/13/2016  Allergen Reaction Noted  . Sulfa antibiotics Rash 09/21/2011    Family History  Problem Relation Age of Onset  . Non-Hodgkin's lymphoma Sister   . Lung cancer Brother   . Colon cancer Neg Hx     Social History   Social History  . Marital status: Divorced    Spouse name: N/A  . Number of children: N/A  . Years of education: N/A   Social History Main Topics  . Smoking status: Never Smoker  . Smokeless tobacco: Never Used  . Alcohol use No  . Drug use: No  . Sexual activity: No   Other Topics Concern  . None   Social History Narrative  . None    Review of Systems: As mentioned in HPI   Physical Exam: BP 116/61   Pulse (!) 48   Temp 97.4 F (36.3 C) (Oral)   Ht 5\' 1"  (1.549 m)   Wt 103 lb 6.4 oz (46.9 kg)   BMI 19.54 kg/m  General:   Alert and oriented. No distress noted. Pleasant and cooperative.  Head:  Normocephalic and atraumatic. Eyes:  Conjuctiva clear without scleral icterus. Abdomen:  +BS, soft, non-tender and non-distended. No rebound or guarding. No HSM or masses noted. Msk:  Symmetrical without gross deformities. Normal posture. Extremities:  Without edema. Neurologic:  Alert and  oriented x4 Psych:  Alert and cooperative. Normal mood and affect.  Lab Results  Component Value Date   WBC 4.7 04/17/2016   HGB 11.8 (L) 04/17/2016   HCT 35.8 (L) 04/17/2016   MCV 90.4 04/17/2016   PLT 225 04/17/2016

## 2016-06-13 NOTE — Assessment & Plan Note (Signed)
GERD symptoms improved, hoarseness overall improved. EGD on file s/p Maloney dilation. Has not been taking a PPI on an empty stomach, and I discussed taking this 30 minutes before breakfast daily. Add Zantac prn as needed in the evening. No alarm symptoms and overall seems to be improving. We will see her in 6 months.

## 2016-06-13 NOTE — Progress Notes (Signed)
CC'D TO PCP °

## 2016-06-13 NOTE — Patient Instructions (Signed)
Start taking Protonix on an empty stomach, 30 minutes before breakfast. You may take Zantac as needed right before bed.   Please have blood work done today.   We recommend a colonoscopy, but you can decide in the future if this is what you want to pursue.   We will see you back in 6 months.

## 2016-06-13 NOTE — Assessment & Plan Note (Signed)
Mild normocytic anemia. Recheck today, add iron, ferritin, TIBC. Still needs to complete ifobt. Colonoscopy due 2018 with history of adenoma; patient desires to hold off on this right now. I discussed importance of surveillance, and she stated understanding. Labs ordered today; we will see her in 6 months and discuss colonoscopy at that time again.

## 2016-06-18 DIAGNOSIS — J209 Acute bronchitis, unspecified: Secondary | ICD-10-CM | POA: Diagnosis not present

## 2016-06-18 DIAGNOSIS — Z6820 Body mass index (BMI) 20.0-20.9, adult: Secondary | ICD-10-CM | POA: Diagnosis not present

## 2016-06-18 DIAGNOSIS — J069 Acute upper respiratory infection, unspecified: Secondary | ICD-10-CM | POA: Diagnosis not present

## 2016-06-18 DIAGNOSIS — Z1389 Encounter for screening for other disorder: Secondary | ICD-10-CM | POA: Diagnosis not present

## 2016-06-24 NOTE — Progress Notes (Signed)
Hgb normal. Ferritin on "low end" of normal. Iron normal. Recheck CBC, iron, ferritin in 3 months.

## 2016-06-27 ENCOUNTER — Other Ambulatory Visit: Payer: Self-pay | Admitting: Gastroenterology

## 2016-06-27 DIAGNOSIS — D649 Anemia, unspecified: Secondary | ICD-10-CM

## 2016-07-04 DIAGNOSIS — J069 Acute upper respiratory infection, unspecified: Secondary | ICD-10-CM | POA: Diagnosis not present

## 2016-07-04 DIAGNOSIS — Z681 Body mass index (BMI) 19 or less, adult: Secondary | ICD-10-CM | POA: Diagnosis not present

## 2016-07-04 DIAGNOSIS — Z1389 Encounter for screening for other disorder: Secondary | ICD-10-CM | POA: Diagnosis not present

## 2016-07-22 ENCOUNTER — Ambulatory Visit: Payer: Medicare Other | Admitting: Gastroenterology

## 2016-09-02 DIAGNOSIS — E782 Mixed hyperlipidemia: Secondary | ICD-10-CM | POA: Diagnosis not present

## 2016-09-02 DIAGNOSIS — L4051 Distal interphalangeal psoriatic arthropathy: Secondary | ICD-10-CM | POA: Diagnosis not present

## 2016-09-02 DIAGNOSIS — K219 Gastro-esophageal reflux disease without esophagitis: Secondary | ICD-10-CM | POA: Diagnosis not present

## 2016-09-02 DIAGNOSIS — R300 Dysuria: Secondary | ICD-10-CM | POA: Diagnosis not present

## 2016-09-02 DIAGNOSIS — M1991 Primary osteoarthritis, unspecified site: Secondary | ICD-10-CM | POA: Diagnosis not present

## 2016-09-02 DIAGNOSIS — Z1389 Encounter for screening for other disorder: Secondary | ICD-10-CM | POA: Diagnosis not present

## 2016-09-02 DIAGNOSIS — Z0001 Encounter for general adult medical examination with abnormal findings: Secondary | ICD-10-CM | POA: Diagnosis not present

## 2016-09-02 DIAGNOSIS — Z681 Body mass index (BMI) 19 or less, adult: Secondary | ICD-10-CM | POA: Diagnosis not present

## 2016-09-02 DIAGNOSIS — G894 Chronic pain syndrome: Secondary | ICD-10-CM | POA: Diagnosis not present

## 2016-09-02 DIAGNOSIS — L309 Dermatitis, unspecified: Secondary | ICD-10-CM | POA: Diagnosis not present

## 2016-09-02 DIAGNOSIS — M5030 Other cervical disc degeneration, unspecified cervical region: Secondary | ICD-10-CM | POA: Diagnosis not present

## 2016-09-12 ENCOUNTER — Other Ambulatory Visit: Payer: Self-pay

## 2016-09-12 DIAGNOSIS — D649 Anemia, unspecified: Secondary | ICD-10-CM

## 2016-09-12 DIAGNOSIS — Z961 Presence of intraocular lens: Secondary | ICD-10-CM | POA: Diagnosis not present

## 2016-09-12 DIAGNOSIS — H2512 Age-related nuclear cataract, left eye: Secondary | ICD-10-CM | POA: Diagnosis not present

## 2016-09-12 DIAGNOSIS — H25812 Combined forms of age-related cataract, left eye: Secondary | ICD-10-CM | POA: Diagnosis not present

## 2016-10-02 DIAGNOSIS — Z681 Body mass index (BMI) 19 or less, adult: Secondary | ICD-10-CM | POA: Diagnosis not present

## 2016-10-02 DIAGNOSIS — L02426 Furuncle of left lower limb: Secondary | ICD-10-CM | POA: Diagnosis not present

## 2016-10-02 DIAGNOSIS — Z23 Encounter for immunization: Secondary | ICD-10-CM | POA: Diagnosis not present

## 2016-10-02 DIAGNOSIS — Z1389 Encounter for screening for other disorder: Secondary | ICD-10-CM | POA: Diagnosis not present

## 2016-10-08 DIAGNOSIS — H25812 Combined forms of age-related cataract, left eye: Secondary | ICD-10-CM | POA: Diagnosis not present

## 2016-10-08 DIAGNOSIS — H52222 Regular astigmatism, left eye: Secondary | ICD-10-CM | POA: Diagnosis not present

## 2016-10-08 DIAGNOSIS — H2512 Age-related nuclear cataract, left eye: Secondary | ICD-10-CM | POA: Diagnosis not present

## 2016-10-15 ENCOUNTER — Encounter: Payer: Self-pay | Admitting: Internal Medicine

## 2016-10-15 DIAGNOSIS — N342 Other urethritis: Secondary | ICD-10-CM | POA: Diagnosis not present

## 2016-10-15 DIAGNOSIS — J069 Acute upper respiratory infection, unspecified: Secondary | ICD-10-CM | POA: Diagnosis not present

## 2016-10-15 DIAGNOSIS — R42 Dizziness and giddiness: Secondary | ICD-10-CM | POA: Diagnosis not present

## 2016-10-15 DIAGNOSIS — K219 Gastro-esophageal reflux disease without esophagitis: Secondary | ICD-10-CM | POA: Diagnosis not present

## 2016-10-15 DIAGNOSIS — Z681 Body mass index (BMI) 19 or less, adult: Secondary | ICD-10-CM | POA: Diagnosis not present

## 2016-10-15 DIAGNOSIS — J302 Other seasonal allergic rhinitis: Secondary | ICD-10-CM | POA: Diagnosis not present

## 2016-10-15 DIAGNOSIS — R0981 Nasal congestion: Secondary | ICD-10-CM | POA: Diagnosis not present

## 2016-10-15 DIAGNOSIS — G894 Chronic pain syndrome: Secondary | ICD-10-CM | POA: Diagnosis not present

## 2016-10-31 ENCOUNTER — Encounter: Payer: Self-pay | Admitting: Internal Medicine

## 2016-10-31 ENCOUNTER — Ambulatory Visit (INDEPENDENT_AMBULATORY_CARE_PROVIDER_SITE_OTHER): Payer: PPO | Admitting: Internal Medicine

## 2016-10-31 VITALS — BP 123/65 | HR 50 | Temp 96.5°F | Ht 61.5 in | Wt 100.6 lb

## 2016-10-31 DIAGNOSIS — Z86010 Personal history of colonic polyps: Secondary | ICD-10-CM

## 2016-10-31 DIAGNOSIS — K219 Gastro-esophageal reflux disease without esophagitis: Secondary | ICD-10-CM | POA: Diagnosis not present

## 2016-10-31 MED ORDER — PANTOPRAZOLE SODIUM 40 MG PO TBEC: 40.0000 mg | DELAYED_RELEASE_TABLET | Freq: Two times a day (BID) | ORAL | 5 refills | Status: DC

## 2016-10-31 NOTE — Patient Instructions (Signed)
GERD information provided  Increase Pantoprazole 40 mg twice daily  Office visit in 3 months  Try and get a better mask when working outside  If you decide to have a colonoscopy as recommended, please let us know and we will schedule

## 2016-10-31 NOTE — Progress Notes (Signed)
Primary Care Physician:  [name], MD Primary Gastroenterologist:  Dr. [name]  Pre-Procedure History & Physical: HPI:  GERMAINE Preston is a 71 y.o. female here for here for follow-up of throat burning and reflux. No true esophageal dysphagia to solids. She's been on Protonix 40 mg daily for a few months now and has not seen any improvement. 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 She is a thin lady she has very good eating habits. She does work in the yard quite a bit and wears one of those cheap paper masks. She is still exposed to quite a bit of environmental allergens. She denies postnasal drip. We previously tried Dexilant but that would've cost her $900 monthly out of pocket.  No another issue is history of anemia; Hemoccult status unknown as patient has not returned a stool sample. Has declined having a surveillance colonoscopy (she is due);  history of colonic adenoma. States her last colonoscopy was uncomfortable and she is not interested in having another one done although we talked about maneuvers which could be employed to provide her a better experience such as utilizing propofol.   Past Medical History:  Diagnosis Date  . Degenerative joint disease    Hands; Rx -parenteral steroids  . Dizziness 12/2011   Onset in 12/2011  . Dyspnea   . Sinus bradycardia   . Thyroid disease     Past Surgical History:  Procedure Laterality Date  . ABDOMINAL HYSTERECTOMY    . CATARACT EXTRACTION W/PHACO Right 10/04/2012   Procedure: CATARACT EXTRACTION PHACO AND INTRAOCULAR LENS PLACEMENT (IOC);  Surgeon: Tonny Branch, MD;  Location: [facility];  Service: Ophthalmology;  Laterality: Right;  CDE:  15.78  . COLONOSCOPY W/ POLYPECTOMY  01/2010   internal hemorrhoids;9 mm rectal tubular adenoma. next tcs 2018. INADEQUATE CONSCIOUS SEDATION PER PATIENT  . ESOPHAGOGASTRODUODENOSCOPY (EGD) WITH PROPOFOL N/A 04/21/2016   Dr. [name]: Normal esophagus s/p Maloney dilation, unable to exclude occult cervical  esophageal web.   Marland Kitchen FINGER ARTHRODESIS Right 06/06/2014   Procedure: ARTHRODESIS RIGHT THUMB INTRAPHALANGEAL JOINT;  Surgeon: Leanora Cover, MD;  Location: [place];  Service: Orthopedics;  Laterality: Right;  . IRRIGATION AND DEBRIDEMENT ABSCESS Left 05/02/2014   Procedure: MINOR INCISION AND DRAINAGE OF LEFT RING FINGER ABSCESS;  Surgeon: Leanora Cover, MD;  Location: [place];  Service: Orthopedics;  Laterality: Left;  I & D LEFT RING FINGER  . 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 MALONEY DILATION N/A 04/21/2016   Procedure: Venia Minks DILATION;  Surgeon: Daneil Dolin, MD;  [facility];  Service: Endoscopy;  Laterality: N/A;  . SKIN CANCER EXCISION     Nose    Prior to Admission medications   Medication Sig Start Date End Date Taking? Authorizing Provider  Biotin 10000 MCG TABS Take 10,000 mcg by mouth daily.   Yes [provider]  Calcium Carb-Cholecalciferol (CALCIUM 500+D PO) Take 1 tablet by mouth daily.   Yes [provider]  estradiol (ESTRACE) 0.5 MG tablet Take 0.5 mg by mouth daily. 03/04/16  Yes [provider]  folic acid (FOLVITE) 235 MCG tablet Take 400 mcg by mouth daily.   Yes [provider]  HYDROcodone-acetaminophen (NORCO/VICODIN) 5-325 MG tablet Take 1 tablet by mouth daily as needed for moderate pain.   Yes [provider]  methimazole (TAPAZOLE) 10 MG tablet Take 10 mg by mouth 2 (two) times daily.    Yes [provider]  Omega-3  Fatty Acids (OMEGA 3 PO) Take 1 capsule by mouth daily.   Yes [provider]  pantoprazole (PROTONIX) 40 MG tablet Take 1 tablet (40 mg total) by mouth daily. 05/01/16  Yes Alixandrea Milleson, Cristopher Estimable, MD  TURMERIC PO Take 1 tablet by mouth daily. Takes when she thinks about it   Yes [provider]  Misc Natural Products (TART CHERRY ADVANCED PO) Take 1 tablet by mouth daily.    [provider]  pantoprazole (PROTONIX)  40 MG tablet Take 1 tablet (40 mg total) by mouth 2 (two) times daily. 10/31/16   Daneil Dolin, MD    Allergies as of 10/31/2016 - Review Complete 10/31/2016  Allergen Reaction Noted  . Sulfa antibiotics Rash 09/21/2011    Family History  Problem Relation Age of Onset  . Non-Hodgkin's lymphoma Sister   . Lung cancer Brother   . Colon cancer Neg Hx     Social History   Social History  . Marital status: Divorced    Spouse name: N/A  . Number of children: N/A  . Years of education: N/A   Occupational History  . Not on file.   Social History Main Topics  . Smoking status: Never Smoker  . Smokeless tobacco: Never Used  . Alcohol use No  . Drug use: No  . Sexual activity: No   Other Topics Concern  . Not on file   Social History Narrative  . No narrative on file    Review of Systems: See HPI, otherwise negative ROS  Physical Exam: BP 123/65   Pulse (!) 50   Temp (!) 96.5 F (35.8 C) (Oral)   Ht 5' 1.5" (1.562 m)   Wt 100 lb 9.6 oz (45.6 kg)   BMI 18.70 kg/m  General:   Alert,  Well-developed, well-nourished, pleasant and cooperative in NAD Mouth:  No deformity or lesions. Neck:  Supple; no masses or thyromegaly. No significant cervical adenopathy. Lungs:  Clear throughout to auscultation.   No wheezes, crackles, or rhonchi. No acute distress. Heart:  Regular rate and rhythm; no murmurs, clicks, rubs,  or gallops. Abdomen: Non-distended, normal bowel sounds.  Soft and nontender without appreciable mass or hepatosplenomegaly.  Pulses:  Normal pulses noted. Extremities:  Without clubbing or edema.  Impression:  71 year old lady with a burning throat. 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 A component of environmental allergen exposure exacerbating situation not excluded. Once daily PPI therapy may be in adequate. For any component of reflux. I explained to the patient if she does have a component of LPR it may take more aggressive acid suppression therapy and a good period of time i.e.  3-6 months for her to enjoy any notable improvement in her symptoms.  I do not feel further diagnostic evaluation is needed at this time such as impedance/pH studies. In no way, would I recommend antireflux surgery.  Recommendations:  GERD information provided  Increase Pantoprazole 40 mg twice daily  Office visit in 3 months  Try and get a better mask when working outside  If you decide to have a colonoscopy as recommended, please let us know and we will schedule       Notice: This dictation was prepared with Dragon dictation along with smaller phrase technology. Any transcriptional errors that result from this process are unintentional and may not be corrected upon review.

## 2016-11-03 ENCOUNTER — Ambulatory Visit: Payer: PRIVATE HEALTH INSURANCE | Admitting: Gastroenterology

## 2016-12-11 DIAGNOSIS — R39198 Other difficulties with micturition: Secondary | ICD-10-CM | POA: Diagnosis not present

## 2016-12-11 DIAGNOSIS — N342 Other urethritis: Secondary | ICD-10-CM | POA: Diagnosis not present

## 2016-12-11 DIAGNOSIS — R350 Frequency of micturition: Secondary | ICD-10-CM | POA: Diagnosis not present

## 2016-12-11 DIAGNOSIS — Z681 Body mass index (BMI) 19 or less, adult: Secondary | ICD-10-CM | POA: Diagnosis not present

## 2016-12-12 ENCOUNTER — Ambulatory Visit: Payer: Medicare Other | Admitting: Gastroenterology

## 2016-12-22 ENCOUNTER — Encounter: Payer: Self-pay | Admitting: Internal Medicine

## 2017-02-12 ENCOUNTER — Ambulatory Visit (INDEPENDENT_AMBULATORY_CARE_PROVIDER_SITE_OTHER): Payer: PPO | Admitting: Obstetrics and Gynecology

## 2017-02-12 ENCOUNTER — Encounter: Payer: Self-pay | Admitting: Obstetrics and Gynecology

## 2017-02-12 DIAGNOSIS — R300 Dysuria: Secondary | ICD-10-CM

## 2017-02-12 LAB — POCT URINALYSIS DIPSTICK
GLUCOSE UA: NEGATIVE
Ketones, UA: NEGATIVE
Nitrite, UA: NEGATIVE
PROTEIN UA: NEGATIVE
RBC UA: NEGATIVE

## 2017-02-12 MED ORDER — HYDROCODONE-ACETAMINOPHEN 5-325 MG PO TABS: 1.0000 | ORAL_TABLET | Freq: Every day | ORAL | 0 refills | Status: DC | PRN

## 2017-02-12 MED ORDER — CIPROFLOXACIN HCL 500 MG PO TABS: 500.0000 mg | ORAL_TABLET | Freq: Two times a day (BID) | ORAL | 0 refills | Status: DC

## 2017-02-12 NOTE — Progress Notes (Signed)
[facility] Visit  02/12/2017            Patient name: Robin Preston MRN [id number]  Date of birth: [birth year]  CC & HPI:  Robin Preston is a 71 y.o. female presenting today for burning sensation with urination onset of.2 days. Pt reports feeling bloated, pressure in stomach/bladder, discomfort while ambulating. She woke up yesterday morning and stated it hurt to pee. She continues to get up every 3 hours at night to pee. She has had a complete hysterectomy when she was 36 due to hx of endometrioses. She has one adopted son, no biological children. Pt taking estrace for night sweats, and hydrocodone for arthritis.   ROS:  ROS  +burning during urination -fever +pressure in lower abdomen   Pertinent History Reviewed:   Reviewed: Significant for abdominal hysterectomy Medical         Past Medical History:  Diagnosis Date  . Degenerative joint disease    Hands; Rx -parenteral steroids  . Dizziness 12/2011   Onset in 12/2011  . Dyspnea   . Sinus bradycardia   . Thyroid disease                               Surgical Hx:    Past Surgical History:  Procedure Laterality Date  . ABDOMINAL HYSTERECTOMY    . CATARACT EXTRACTION W/PHACO Right 10/04/2012   Procedure: CATARACT EXTRACTION PHACO AND INTRAOCULAR LENS PLACEMENT (IOC);  Surgeon: Tonny Branch, MD;  Location: [facility];  Service: Ophthalmology;  Laterality: Right;  CDE:  15.78  . COLONOSCOPY W/ POLYPECTOMY  01/2010   internal hemorrhoids;9 mm rectal tubular adenoma. next tcs 2018. INADEQUATE CONSCIOUS SEDATION PER PATIENT  . ESOPHAGOGASTRODUODENOSCOPY (EGD) WITH PROPOFOL N/A 04/21/2016   Dr. [name]: Normal esophagus s/p Maloney dilation, unable to exclude occult cervical esophageal web.   Marland Kitchen FINGER ARTHRODESIS Right 06/06/2014   Procedure: ARTHRODESIS RIGHT THUMB INTRAPHALANGEAL JOINT;  Surgeon: Leanora Cover, MD;  Location: [place];  Service: Orthopedics;  Laterality: Right;  . IRRIGATION AND DEBRIDEMENT ABSCESS  Left 05/02/2014   Procedure: MINOR INCISION AND DRAINAGE OF LEFT RING FINGER ABSCESS;  Surgeon: Leanora Cover, MD;  Location: [place];  Service: Orthopedics;  Laterality: Left;  I & D LEFT RING FINGER  . 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 MALONEY DILATION N/A 04/21/2016   Procedure: Venia Minks DILATION;  Surgeon: Daneil Dolin, MD;  [facility];  Service: Endoscopy;  Laterality: N/A;  . SKIN CANCER EXCISION     Nose   Medications: Reviewed & Updated - see associated section                       Current Outpatient Prescriptions:  .  Biotin 10000 MCG TABS, Take 10,000 mcg by mouth daily., Disp: , Rfl:  .  Calcium Carb-Cholecalciferol (CALCIUM 500+D PO), Take 1 tablet by mouth daily., Disp: , Rfl:  .  estradiol (ESTRACE) 0.5 MG tablet, Take 0.5 mg by mouth daily., Disp: , Rfl:  .  folic acid (FOLVITE) 683 MCG tablet, Take 400 mcg by mouth daily., Disp: , Rfl:  .  HYDROcodone-acetaminophen (NORCO/VICODIN) 5-325 MG tablet, Take 1 tablet by mouth daily as needed for moderate pain., Disp: , Rfl:  .  methimazole (TAPAZOLE) 10 MG tablet, Take 10 mg by mouth 2 (two) times daily. , Disp: , Rfl:  .  Misc Natural Products (TART CHERRY ADVANCED PO), Take 1 tablet by mouth daily., Disp: , Rfl:  .  Omega-3 Fatty Acids (OMEGA 3 PO), Take 1 capsule by mouth daily., Disp: , Rfl:  .  pantoprazole (PROTONIX) 40 MG tablet, Take 1 tablet (40 mg total) by mouth daily., Disp: 30 tablet, Rfl: 11 .  pantoprazole (PROTONIX) 40 MG tablet, Take 1 tablet (40 mg total) by mouth 2 (two) times daily., Disp: 60 tablet, Rfl: 5 .  TURMERIC PO, Take 1 tablet by mouth daily. Takes when she thinks about it, Disp: , Rfl:  No current facility-administered medications for this visit.   Facility-Administered Medications Ordered in Other Visits:  .  lidocaine hcl (ophth) (AKTEN) 3.5 % ophthalmic gel 1 application, 1 application, Right Eye, Once, Tonny Branch, MD   Social History: Reviewed  -  reports that she has never smoked. She has never used smokeless tobacco.  Objective Findings:  Vitals: There were no vitals taken for this visit.  Physical Examination: General appearance - alert, well appearing, and in no distress Mental status - alert, oriented to person, place, and time Pelvic -  VULVA: normal appearing vulva with no masses, tenderness or lesions,  VAGINA: normal appearing vagina with normal color and discharge, no lesions, slight cystocele, minimal rotation of urethra with coughing, atrophic CERVIX: surgically absent,  UTERUS: surgically absent, vaginal cuff well healed,  ADNEXA: surgically absent    Assessment & Plan:   A:  1. UTI?  2. Chronic arthritis 3. Stable vasomotor symptoms on Estradiol oral P:  1. Prescribe Cipro 2x a day for 3 days 2. Refill hydrocodone for arthritis    By signing my name below, I, Robin Preston, attest that this documentation has been prepared under the direction and in the presence of Jonnie Kind, MD. Electronically Signed: Jabier Gauss, Medical Scribe. 02/12/17. 1:41 PM.  I personally performed the services described in this documentation, which was SCRIBED in my presence. The recorded information has been reviewed and considered accurate. It has been edited as necessary during review. Jonnie Kind, MD

## 2017-03-20 DIAGNOSIS — Z681 Body mass index (BMI) 19 or less, adult: Secondary | ICD-10-CM | POA: Diagnosis not present

## 2017-03-20 DIAGNOSIS — H6121 Impacted cerumen, right ear: Secondary | ICD-10-CM | POA: Diagnosis not present

## 2017-03-20 DIAGNOSIS — N342 Other urethritis: Secondary | ICD-10-CM | POA: Diagnosis not present

## 2017-03-20 DIAGNOSIS — J329 Chronic sinusitis, unspecified: Secondary | ICD-10-CM | POA: Diagnosis not present

## 2017-03-25 ENCOUNTER — Encounter: Payer: Self-pay | Admitting: Nurse Practitioner

## 2017-03-25 ENCOUNTER — Ambulatory Visit (INDEPENDENT_AMBULATORY_CARE_PROVIDER_SITE_OTHER): Payer: PPO | Admitting: Nurse Practitioner

## 2017-03-25 DIAGNOSIS — R109 Unspecified abdominal pain: Secondary | ICD-10-CM | POA: Diagnosis not present

## 2017-03-25 DIAGNOSIS — R112 Nausea with vomiting, unspecified: Secondary | ICD-10-CM | POA: Insufficient documentation

## 2017-03-25 DIAGNOSIS — R1115 Cyclical vomiting syndrome unrelated to migraine: Secondary | ICD-10-CM

## 2017-03-25 DIAGNOSIS — G43A0 Cyclical vomiting, not intractable: Secondary | ICD-10-CM

## 2017-03-25 MED ORDER — ONDANSETRON HCL 4 MG PO TABS: 4.0000 mg | ORAL_TABLET | Freq: Three times a day (TID) | ORAL | 1 refills | Status: DC | PRN

## 2017-03-25 NOTE — Progress Notes (Signed)
cc'ed to pcp °

## 2017-03-25 NOTE — Patient Instructions (Addendum)
1. Start taking a probiotic to help place good bacteria in your colon. He can take this for 30-60 days. There are multiple options and multiple forms available at the pharmacy. You can speak with the pharmacist to help guide your decision making in order to pick a good option for you. 2. I sent an Zofran to your pharmacy that she can take every 8 hours as needed for nausea. 3. Continue to encourage and push fluids. 4. Start on a clear liquid diet for the next 2-3 days. You can advance this to a "brat" diet as tolerated. You can then advance her diet back to a regular diet when you are feeling better. 5. Return for follow-up in 4 weeks. 6. Call if you have any questions or concerns.      Clear Liquid Diet, Adult A clear liquid diet is a diet that includes only liquids that you can see through. You may need to follow a clear liquid diet if:  You develop a medical condition right before or after you have surgery.  You were not able to eat food for a long period of time.  You had a condition that gave you diarrhea.  You are going to have an exam, such as a colonoscopy, in which instruments will be put into your body to look at parts of your digestive system.  You are going to have bowel surgery.  The usual goals of this diet are:  To rest the stomach and digestive system as much as possible.  To keep you hydrated.  To make sure you get some calories for energy.  To help you return to normal digestion.  Most people need to follow this diet for only a short period of time. What do I need to know about this diet?  A clear liquid is a liquid that you can see through when you hold it up to a light.  A clear liquid diet does not provide all the nutrients that you need. It is important to choose a variety of the liquids that are allowed on this diet. That way, you will get as many nutrients as possible.  If you are not sure whether you can have certain items, ask your health care  provider. What can I have?  Water and flavored water.  Fruit juices that do not have pulp, such as cranberry juice and apple juice.  Tea and coffee without milk or cream.  Clear bouillon or broth.  Broth-based soups that have been strained.  Flavored gelatins.  Honey.  Sugar water.  Frozen ice or frozen ice pops that do not contain milk, yogurt, fruit pieces, or fruit pulp.  Clear sodas.  Clear sports drinks. The items listed above may not be a complete list of recommended liquids. Contact your dietitian for more options. What can I not have?  Juices that have pulp.  Milk.  Cream or cream-based soups.  Yogurt. The items listed above may not be a complete list of liquids to avoid. Contact your dietitian for more information. Summary  A clear liquid diet is a diet that includes only liquids that you can see through.  The goal of this diet is to help you recover by resting your digestive system, keeping you hydrated, and providing nutrients.  Make sure to avoid liquids with milk, cream, or pulp while on this diet. This information is not intended to replace advice given to you by your health care provider. Make sure you discuss any questions you have with  your health care provider. Document Released: 06/09/2005 Document Revised: 01/22/2016 Document Reviewed: 05/06/2013 Elsevier Interactive Patient Education  2018 [place] Diet A bland diet consists of foods that do not have a lot of fat or fiber. Foods without fat or fiber are easier for the body to digest. They are also less likely to irritate your mouth, throat, stomach, and other parts of your gastrointestinal tract. A bland diet is sometimes called a BRAT diet. What is my plan? Your health care provider or dietitian may recommend specific changes to your diet to prevent and treat your symptoms, such as:  Eating small meals often.  Cooking food until it is soft enough to chew easily.  Chewing  your food well.  Drinking fluids slowly.  Not eating foods that are very spicy, sour, or fatty.  Not eating citrus fruits, such as oranges and grapefruit.  What do I need to know about this diet?  Eat a variety of foods from the bland diet food list.  Do not follow a bland diet longer than you have to.  Ask your health care provider whether you should take vitamins. What foods can I eat? Grains  Hot cereals, such as cream of wheat. Bread, crackers, or tortillas made from refined white flour. Rice. Vegetables Canned or cooked vegetables. Mashed or boiled potatoes. Fruits Bananas. Applesauce. Other types of cooked or canned fruit with the skin and seeds removed, such as canned peaches or pears. Meats and Other Protein Sources Scrambled eggs. Creamy peanut butter or other nut butters. Lean, well-cooked meats, such as chicken or fish. Tofu. Soups or broths. Dairy Low-fat dairy products, such as milk, cottage cheese, or yogurt. Beverages Water. Herbal tea. Apple juice. Sweets and Desserts Pudding. Custard. Fruit gelatin. Ice cream. Fats and Oils Mild salad dressings. Canola or olive oil. The items listed above may not be a complete list of allowed foods or beverages. Contact your dietitian for more options. What foods are not recommended? Foods and ingredients that are often not recommended include:  Spicy foods, such as hot sauce or salsa.  Fried foods.  Sour foods, such as pickled or fermented foods.  Raw vegetables or fruits, especially citrus or berries.  Caffeinated drinks.  Alcohol.  Strongly flavored seasonings or condiments.  The items listed above may not be a complete list of foods and beverages that are not allowed. Contact your dietitian for more information. This information is not intended to replace advice given to you by your health care provider. Make sure you discuss any questions you have with your health care provider. Document Released: 10/01/2015  Document Revised: 11/15/2015 Document Reviewed: 06/21/2014 Elsevier Interactive Patient Education  2018 [employer].

## 2017-03-25 NOTE — Progress Notes (Signed)
Referring Provider: [name], MD Primary Care Physician:  [name], MD Primary GI:  Dr. [name]  Chief Complaint  Patient presents with  . Abdominal Pain  . Nausea  . Emesis    HPI:   Robin Preston is a 71 y.o. female who presents for evaluation of abdominal pain, nausea, vomiting. The patient was last seen in our office 10/31/2016 for history of colon polyps, GERD, laryngopharyngeal reflux. Is been on Protonix for a few months at the time of her last visit and no improvement. Frequent environmental allergies exposure, denies postnasal drip. Attempted Dexilant previously but too expensive for her. She is also having anemia and Hemoccult status unknown because she is not refined a stool sample. She declined surveillance colonoscopy despite being due because her previous one was uncomfortable. Her symptoms are deemed probable LPR, can exclude environmental allergen exposure. No need for impedance or pH studies, no need for antireflux surgery. Increase Protonix to 40 mg twice daily, return for follow-up in 3 months, where a better mask and working outdoors.  Today she states she's not feeling well. Went to PCP 5 days ago for UTI and was given medications. When she started it, she started having a lot of nausea; she informed themand was told to take it in the morning and not eat for a while. Continued with nausea and gagging even despite this. Abdominal pain started about the next day. Her symptoms became intolerable and she stopped after the 5th day of a 10 day course. Not having any UTI symptoms. She is planning to see a urologist. Laying down is the only thing that improves her pain, nothing makes it worse. Eating makes the nausea worse. Denies diarrhea, fever, chills, hematochezia, melena. Is able to keep down fluids, but unable to eat much. Denies chest pain, dyspnea, dizziness, lightheadedness, syncope, near syncope. Denies any other upper or lower GI symptoms.  Past Medical History:    Diagnosis Date  . Degenerative joint disease    Hands; Rx -parenteral steroids  . Dizziness 12/2011   Onset in 12/2011  . Dyspnea   . Sinus bradycardia   . Thyroid disease     Past Surgical History:  Procedure Laterality Date  . ABDOMINAL HYSTERECTOMY    . CATARACT EXTRACTION W/PHACO Right 10/04/2012   Procedure: CATARACT EXTRACTION PHACO AND INTRAOCULAR LENS PLACEMENT (IOC);  Surgeon: Tonny Branch, MD;  Location: [facility];  Service: Ophthalmology;  Laterality: Right;  CDE:  15.78  . COLONOSCOPY W/ POLYPECTOMY  01/2010   internal hemorrhoids;9 mm rectal tubular adenoma. next tcs 2018. INADEQUATE CONSCIOUS SEDATION PER PATIENT  . ESOPHAGOGASTRODUODENOSCOPY (EGD) WITH PROPOFOL N/A 04/21/2016   Dr. [name]: Normal esophagus s/p Maloney dilation, unable to exclude occult cervical esophageal web.   Marland Kitchen FINGER ARTHRODESIS Right 06/06/2014   Procedure: ARTHRODESIS RIGHT THUMB INTRAPHALANGEAL JOINT;  Surgeon: Leanora Cover, MD;  Location: [facility];  Service: Orthopedics;  Laterality: Right;  . IRRIGATION AND DEBRIDEMENT ABSCESS Left 05/02/2014   Procedure: MINOR INCISION AND DRAINAGE OF LEFT RING FINGER ABSCESS;  Surgeon: Leanora Cover, MD;  Location: [facility];  Service: Orthopedics;  Laterality: Left;  I & D LEFT RING FINGER  . 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 MALONEY DILATION N/A 04/21/2016   Procedure: Venia Minks DILATION;  Surgeon: Daneil Dolin, MD;  Location: [facility];  Service: Endoscopy;  Laterality: N/A;  . SKIN CANCER EXCISION     Nose    Current  Outpatient Prescriptions  Medication Sig Dispense Refill  . Biotin 10000 MCG TABS Take 10,000 mcg by mouth daily.    . Calcium Carb-Cholecalciferol (CALCIUM 500+D PO) Take 1 tablet by mouth daily.    . ciprofloxacin (CIPRO) 500 MG tablet Take 1 tablet (500 mg total) by mouth 2 (two) times daily. 10 tablet 0  . estradiol (ESTRACE) 0.5 MG tablet Take 0.5 mg by mouth daily.    . folic acid  (FOLVITE) 101 MCG tablet Take 400 mcg by mouth daily.    Marland Kitchen HYDROcodone-acetaminophen (NORCO/VICODIN) 5-325 MG tablet Take 1 tablet by mouth daily as needed for moderate pain. 30 tablet 0  . methimazole (TAPAZOLE) 10 MG tablet Take 10 mg by mouth 2 (two) times daily.     . Omega-3 Fatty Acids (OMEGA 3 PO) Take 1 capsule by mouth daily.    . pantoprazole (PROTONIX) 40 MG tablet Take 1 tablet (40 mg total) by mouth 2 (two) times daily. 60 tablet 5  . levofloxacin (LEVAQUIN) 500 MG tablet Take 1 tablet by mouth daily.  0   No current facility-administered medications for this visit.    Facility-Administered Medications Ordered in Other Visits  Medication Dose Route Frequency Provider Last Rate Last Dose  . lidocaine hcl (ophth) (AKTEN) 3.5 % ophthalmic gel 1 application  1 application Right Eye Once Tonny Branch, MD        Allergies as of 03/25/2017 - Review Complete 03/25/2017  Allergen Reaction Noted  . Sulfa antibiotics Rash 09/21/2011    Family History  Problem Relation Age of Onset  . Non-Hodgkin's lymphoma Sister   . Lung cancer Brother   . Colon cancer Neg Hx     Social History   Social History  . Marital status: Divorced    Spouse name: N/A  . Number of children: N/A  . Years of education: N/A   Social History Main Topics  . Smoking status: Never Smoker  . Smokeless tobacco: Never Used  . Alcohol use No  . Drug use: No  . Sexual activity: No   Other Topics Concern  . None   Social History Narrative  . None    Review of Systems: General: Negative for anorexia, weight loss, fever, chills. ENT: Negative for hoarseness, difficulty swallowing. CV: Negative for chest pain, angina, palpitations, peripheral edema.  Respiratory: Negative for dyspnea at rest, cough, sputum, wheezing.  GI: See history of present illness. MS: Negative for joint pain, low back pain.  Derm: Negative for rash or itching.  Endo: Negative for unusual weight change.  Heme: Negative for  bruising or bleeding. Allergy: Negative for rash or hives.   Physical Exam: BP 116/70   Pulse 65   Temp (!) 97.1 F (36.2 C) (Oral)   Ht 5' 1.5" (1.562 m)   Wt 98 lb (44.5 kg)   BMI 18.22 kg/m  General:   Alert and oriented. Pleasant and cooperative. Well-nourished and well-developed. Appears significantly uncomfortable, borderline tearful, holding her stomach. Head:  Normocephalic and atraumatic. Eyes:  Without icterus, sclera clear and conjunctiva pink.  Ears:  Normal auditory acuity. Cardiovascular:  S1, S2 present without murmurs appreciated. Extremities without clubbing or edema. Respiratory:  Clear to auscultation bilaterally. No wheezes, rales, or rhonchi. No distress.  Gastrointestinal:  +BS, soft, and non-distended. Significant TTP mid-abdomen. No HSM noted. No guarding or rebound. No masses appreciated.  Rectal:  Deferred  Musculoskalatal:  Symmetrical without gross deformities. Neurologic:  Alert and oriented x4;  grossly normal neurologically. Psych:  Alert and  cooperative. Normal mood and affect. Heme/Lymph/Immune: No excessive bruising noted.    03/25/2017 3:03 PM   Disclaimer: This note was dictated with voice recognition software. Similar sounding words can inadvertently be transcribed and may not be corrected upon review.

## 2017-03-25 NOTE — Assessment & Plan Note (Signed)
Persistent nausea and vomiting which began when she started taking Levaquin for her urinary tract infection. She subsequently stopped her antibiotics as per history of present illness. At this point she is declining further significant workup including CBC, CMP, lipase, abdominal imaging. Further treatment as per below to include Zofran, probiotic, diet alteration, encourage fluids. ER precautions given. Call if no improvement or any worsening symptoms. Return for follow-up in 4 weeks.

## 2017-03-25 NOTE — Assessment & Plan Note (Addendum)
The patient describes significant abdominal pain which started 1-2 days after she began taking an antibiotic for urinary tract infection. She call her primary care who requested she persist with the antibiotic and give recommendations to help with her symptoms. These did not help. She subsequently stop her antibiotic after day 5 out of 10 day therapy. Her symptoms including abdominal pain, nausea, vomiting could be due to antibiotic adverse effect. There could be a more serious pathology going on as well. At this point for symptomatic management I will send an Zofran up to every 8 hours as needed for nausea, probiotic for the next 30-60 days, clear liquid diet for 2-3 days and advance as tolerated to a brat diet and then to a regular diet. Recommend she push fluids.  I recommended labs including CBC, CMP, lipase as well as a CT of the abdomen and pelvis to evaluate for more significant pathology given her persistent, and apparently severe symptoms. However, she declined these at this time. Return for follow-up in 4 weeks. Call if any worsening. ER precautions given.  I have updated her allergy list to reflect her possible allergy for Levaquin.

## 2017-03-26 ENCOUNTER — Ambulatory Visit (INDEPENDENT_AMBULATORY_CARE_PROVIDER_SITE_OTHER): Payer: PPO | Admitting: Otolaryngology

## 2017-03-26 DIAGNOSIS — J310 Chronic rhinitis: Secondary | ICD-10-CM | POA: Diagnosis not present

## 2017-03-26 DIAGNOSIS — H903 Sensorineural hearing loss, bilateral: Secondary | ICD-10-CM | POA: Diagnosis not present

## 2017-03-26 DIAGNOSIS — H6123 Impacted cerumen, bilateral: Secondary | ICD-10-CM

## 2017-03-27 ENCOUNTER — Ambulatory Visit (INDEPENDENT_AMBULATORY_CARE_PROVIDER_SITE_OTHER): Payer: PPO | Admitting: Urology

## 2017-03-27 ENCOUNTER — Other Ambulatory Visit (HOSPITAL_COMMUNITY)
Admission: RE | Admit: 2017-03-27 | Discharge: 2017-03-27 | Disposition: A | Discharge status: Home / Self Care | Payer: PPO | Source: Other Acute Inpatient Hospital | Attending: Urology | Admitting: Urology

## 2017-03-27 DIAGNOSIS — Z87440 Personal history of urinary (tract) infections: Secondary | ICD-10-CM | POA: Insufficient documentation

## 2017-03-27 DIAGNOSIS — N393 Stress incontinence (female) (male): Secondary | ICD-10-CM

## 2017-03-27 DIAGNOSIS — N952 Postmenopausal atrophic vaginitis: Secondary | ICD-10-CM | POA: Diagnosis not present

## 2017-03-27 LAB — URINALYSIS, COMPLETE (UACMP) WITH MICROSCOPIC
BILIRUBIN URINE: NEGATIVE
Bacteria, UA: NONE SEEN
GLUCOSE, UA: NEGATIVE mg/dL
Hgb urine dipstick: NEGATIVE
KETONES UR: NEGATIVE mg/dL
Nitrite: NEGATIVE
PH: 5 (ref 5.0–8.0)
Protein, ur: NEGATIVE mg/dL
Specific Gravity, Urine: 1.014 (ref 1.005–1.030)

## 2017-03-29 LAB — URINE CULTURE

## 2017-05-01 ENCOUNTER — Ambulatory Visit: Payer: PPO | Admitting: Nurse Practitioner

## 2017-05-19 DIAGNOSIS — Z961 Presence of intraocular lens: Secondary | ICD-10-CM | POA: Diagnosis not present

## 2017-06-09 DIAGNOSIS — Z961 Presence of intraocular lens: Secondary | ICD-10-CM | POA: Diagnosis not present

## 2017-06-11 DIAGNOSIS — M1991 Primary osteoarthritis, unspecified site: Secondary | ICD-10-CM | POA: Diagnosis not present

## 2017-06-11 DIAGNOSIS — E0590 Thyrotoxicosis, unspecified without thyrotoxic crisis or storm: Secondary | ICD-10-CM | POA: Diagnosis not present

## 2017-06-11 DIAGNOSIS — Z681 Body mass index (BMI) 19 or less, adult: Secondary | ICD-10-CM | POA: Diagnosis not present

## 2017-06-11 DIAGNOSIS — L309 Dermatitis, unspecified: Secondary | ICD-10-CM | POA: Diagnosis not present

## 2017-07-02 ENCOUNTER — Other Ambulatory Visit: Payer: Self-pay | Admitting: Internal Medicine

## 2017-07-02 DIAGNOSIS — Z681 Body mass index (BMI) 19 or less, adult: Secondary | ICD-10-CM | POA: Diagnosis not present

## 2017-07-02 DIAGNOSIS — H9202 Otalgia, left ear: Secondary | ICD-10-CM | POA: Diagnosis not present

## 2017-07-02 DIAGNOSIS — N342 Other urethritis: Secondary | ICD-10-CM | POA: Diagnosis not present

## 2017-07-09 DIAGNOSIS — E871 Hypo-osmolality and hyponatremia: Secondary | ICD-10-CM | POA: Diagnosis not present

## 2017-07-23 DIAGNOSIS — N390 Urinary tract infection, site not specified: Secondary | ICD-10-CM | POA: Diagnosis not present

## 2017-07-23 DIAGNOSIS — E871 Hypo-osmolality and hyponatremia: Secondary | ICD-10-CM | POA: Diagnosis not present

## 2017-07-23 DIAGNOSIS — R001 Bradycardia, unspecified: Secondary | ICD-10-CM | POA: Diagnosis not present

## 2017-07-31 DIAGNOSIS — E559 Vitamin D deficiency, unspecified: Secondary | ICD-10-CM | POA: Diagnosis not present

## 2017-07-31 DIAGNOSIS — E871 Hypo-osmolality and hyponatremia: Secondary | ICD-10-CM | POA: Diagnosis not present

## 2017-07-31 DIAGNOSIS — Z79899 Other long term (current) drug therapy: Secondary | ICD-10-CM | POA: Diagnosis not present

## 2017-07-31 DIAGNOSIS — I10 Essential (primary) hypertension: Secondary | ICD-10-CM | POA: Diagnosis not present

## 2017-08-17 DIAGNOSIS — E039 Hypothyroidism, unspecified: Secondary | ICD-10-CM | POA: Insufficient documentation

## 2017-08-17 DIAGNOSIS — E871 Hypo-osmolality and hyponatremia: Secondary | ICD-10-CM | POA: Diagnosis not present

## 2017-08-17 DIAGNOSIS — K219 Gastro-esophageal reflux disease without esophagitis: Secondary | ICD-10-CM | POA: Insufficient documentation

## 2017-10-22 DIAGNOSIS — Z0001 Encounter for general adult medical examination with abnormal findings: Secondary | ICD-10-CM | POA: Diagnosis not present

## 2017-10-22 DIAGNOSIS — G894 Chronic pain syndrome: Secondary | ICD-10-CM | POA: Diagnosis not present

## 2017-10-22 DIAGNOSIS — Z1389 Encounter for screening for other disorder: Secondary | ICD-10-CM | POA: Diagnosis not present

## 2017-10-22 DIAGNOSIS — Z681 Body mass index (BMI) 19 or less, adult: Secondary | ICD-10-CM | POA: Diagnosis not present

## 2017-10-22 DIAGNOSIS — H6120 Impacted cerumen, unspecified ear: Secondary | ICD-10-CM | POA: Diagnosis not present

## 2017-11-23 DIAGNOSIS — Z681 Body mass index (BMI) 19 or less, adult: Secondary | ICD-10-CM | POA: Diagnosis not present

## 2017-11-23 DIAGNOSIS — N342 Other urethritis: Secondary | ICD-10-CM | POA: Diagnosis not present

## 2017-11-23 DIAGNOSIS — R350 Frequency of micturition: Secondary | ICD-10-CM | POA: Diagnosis not present

## 2017-12-11 DIAGNOSIS — Z1389 Encounter for screening for other disorder: Secondary | ICD-10-CM | POA: Diagnosis not present

## 2017-12-11 DIAGNOSIS — K219 Gastro-esophageal reflux disease without esophagitis: Secondary | ICD-10-CM | POA: Diagnosis not present

## 2017-12-11 DIAGNOSIS — N342 Other urethritis: Secondary | ICD-10-CM | POA: Diagnosis not present

## 2017-12-11 DIAGNOSIS — M1991 Primary osteoarthritis, unspecified site: Secondary | ICD-10-CM | POA: Diagnosis not present

## 2017-12-11 DIAGNOSIS — N390 Urinary tract infection, site not specified: Secondary | ICD-10-CM | POA: Diagnosis not present

## 2017-12-11 DIAGNOSIS — Z681 Body mass index (BMI) 19 or less, adult: Secondary | ICD-10-CM | POA: Diagnosis not present

## 2017-12-14 ENCOUNTER — Other Ambulatory Visit (HOSPITAL_COMMUNITY): Payer: Self-pay | Admitting: Internal Medicine

## 2017-12-14 DIAGNOSIS — R519 Headache, unspecified: Secondary | ICD-10-CM

## 2017-12-14 DIAGNOSIS — R42 Dizziness and giddiness: Secondary | ICD-10-CM

## 2017-12-14 DIAGNOSIS — G459 Transient cerebral ischemic attack, unspecified: Secondary | ICD-10-CM

## 2017-12-14 DIAGNOSIS — R51 Headache: Principal | ICD-10-CM

## 2017-12-28 ENCOUNTER — Ambulatory Visit (HOSPITAL_COMMUNITY)
Admission: RE | Admit: 2017-12-28 | Discharge: 2017-12-28 | Disposition: A | Discharge status: Home / Self Care | Payer: PPO | Source: Ambulatory Visit | Attending: Internal Medicine | Admitting: Internal Medicine

## 2017-12-28 DIAGNOSIS — R51 Headache: Secondary | ICD-10-CM | POA: Insufficient documentation

## 2017-12-28 DIAGNOSIS — G459 Transient cerebral ischemic attack, unspecified: Secondary | ICD-10-CM

## 2017-12-28 DIAGNOSIS — R269 Unspecified abnormalities of gait and mobility: Secondary | ICD-10-CM | POA: Diagnosis not present

## 2017-12-28 DIAGNOSIS — R42 Dizziness and giddiness: Secondary | ICD-10-CM

## 2017-12-28 DIAGNOSIS — Z681 Body mass index (BMI) 19 or less, adult: Secondary | ICD-10-CM | POA: Diagnosis not present

## 2017-12-28 DIAGNOSIS — R519 Headache, unspecified: Secondary | ICD-10-CM

## 2017-12-28 DIAGNOSIS — J329 Chronic sinusitis, unspecified: Secondary | ICD-10-CM | POA: Diagnosis not present

## 2018-01-13 ENCOUNTER — Other Ambulatory Visit (HOSPITAL_COMMUNITY): Payer: Self-pay | Admitting: Internal Medicine

## 2018-01-13 DIAGNOSIS — Z1231 Encounter for screening mammogram for malignant neoplasm of breast: Secondary | ICD-10-CM

## 2018-01-21 ENCOUNTER — Ambulatory Visit: Payer: PPO | Admitting: Advanced Practice Midwife

## 2018-01-21 ENCOUNTER — Ambulatory Visit (HOSPITAL_COMMUNITY)
Admission: RE | Admit: 2018-01-21 | Discharge: 2018-01-21 | Disposition: A | Discharge status: Home / Self Care | Payer: PPO | Source: Ambulatory Visit | Attending: Internal Medicine | Admitting: Internal Medicine

## 2018-01-21 ENCOUNTER — Encounter: Payer: Self-pay | Admitting: Advanced Practice Midwife

## 2018-01-21 ENCOUNTER — Other Ambulatory Visit: Payer: Self-pay

## 2018-01-21 VITALS — BP 165/66 | HR 48 | Ht 61.5 in | Wt 102.0 lb

## 2018-01-21 DIAGNOSIS — Z1231 Encounter for screening mammogram for malignant neoplasm of breast: Secondary | ICD-10-CM | POA: Diagnosis not present

## 2018-01-21 DIAGNOSIS — R102 Pelvic and perineal pain: Secondary | ICD-10-CM | POA: Diagnosis not present

## 2018-01-21 LAB — POCT URINALYSIS DIPSTICK OB
Blood, UA: NEGATIVE
GLUCOSE, UA: NEGATIVE — AB
KETONES UA: NEGATIVE
Leukocytes, UA: NEGATIVE
Nitrite, UA: NEGATIVE
POC,PROTEIN,UA: NEGATIVE

## 2018-01-21 NOTE — Progress Notes (Signed)
[facility] Visit  Patient name: Robin Preston MRN [id number]  Date of birth: 01-Sep-1945  CC & HPI:  Robin Preston is a 72 y.o. Caucasian female presenting today for 3 week hx of some pressure in lower abdomen.  Denies any UTI sx, said she had one a few months ago and this is not that.  Pain/pressure was relieved with a tight girdle, felt better w/support.  It has gotten gradually better over the past few weeks, still there a little but doesn't have to wear girdle any more. Curious as to what may have caused it.   Pertinent History Reviewed:  Medical & Surgical Hx:   Past Medical History:  Diagnosis Date  . Degenerative joint disease    Hands; Rx -parenteral steroids  . Dizziness 12/2011   Onset in 12/2011  . Dyspnea   . Sinus bradycardia   . Thyroid disease    Past Surgical History:  Procedure Laterality Date  . ABDOMINAL HYSTERECTOMY    . CATARACT EXTRACTION W/PHACO Right 10/04/2012   Procedure: CATARACT EXTRACTION PHACO AND INTRAOCULAR LENS PLACEMENT (IOC);  Surgeon: Tonny Branch, MD;  Location: [facility];  Service: Ophthalmology;  Laterality: Right;  CDE:  15.78  . COLONOSCOPY W/ POLYPECTOMY  01/2010   internal hemorrhoids;9 mm rectal tubular adenoma. next tcs 2018. INADEQUATE CONSCIOUS SEDATION PER PATIENT  . ESOPHAGOGASTRODUODENOSCOPY (EGD) WITH PROPOFOL N/A 04/21/2016   Dr. [name]: Normal esophagus s/p Maloney dilation, unable to exclude occult cervical esophageal web.   Marland Kitchen FINGER ARTHRODESIS Right 06/06/2014   Procedure: ARTHRODESIS RIGHT THUMB INTRAPHALANGEAL JOINT;  Surgeon: Leanora Cover, MD;  Location: [facility];  Service: Orthopedics;  Laterality: Right;  . IRRIGATION AND DEBRIDEMENT ABSCESS Left 05/02/2014   Procedure: MINOR INCISION AND DRAINAGE OF LEFT RING FINGER ABSCESS;  Surgeon: Leanora Cover, MD;  Location: [facility];  Service: Orthopedics;  Laterality: Left;  I & D LEFT RING FINGER  . 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  MALONEY DILATION N/A 04/21/2016   Procedure: Venia Minks DILATION;  Surgeon: Daneil Dolin, MD;  Location: [facility];  Service: Endoscopy;  Laterality: N/A;  . SKIN CANCER EXCISION     Nose   Family History  Problem Relation Age of Onset  . Non-Hodgkin's lymphoma Sister   . Lung cancer Brother   . Colon cancer Neg Hx     Current Outpatient Medications:  .  Biotin 10000 MCG TABS, Take 10,000 mcg by mouth daily., Disp: , Rfl:  .  Calcium Carb-Cholecalciferol (CALCIUM 500+D PO), Take 1 tablet by mouth daily., Disp: , Rfl:  .  estradiol (ESTRACE) 0.5 MG tablet, Take 0.5 mg by mouth daily., Disp: , Rfl:  .  folic acid (FOLVITE) 924 MCG tablet, Take 400 mcg by mouth daily., Disp: , Rfl:  .  HYDROcodone-acetaminophen (NORCO/VICODIN) 5-325 MG tablet, Take 1 tablet by mouth daily as needed for moderate pain., Disp: 30 tablet, Rfl: 0 .  meloxicam (MOBIC) 15 MG tablet, TK 1 T PO D, Disp: , Rfl: 11 .  methimazole (TAPAZOLE) 10 MG tablet, Take 10 mg by mouth 2 (two) times daily. , Disp: , Rfl:  .  pantoprazole (PROTONIX) 40 MG tablet, TAKE 1 TABLET(40 MG) BY MOUTH TWICE DAILY, Disp: 60 tablet, Rfl: 5 .  Omega-3 Fatty Acids (OMEGA 3 PO), Take 1 capsule by mouth daily., Disp: , Rfl:  No current facility-administered medications for this visit.   Facility-Administered Medications Ordered in Other Visits:  .  lidocaine hcl (ophth) (AKTEN) 3.5 % ophthalmic gel 1 application, 1 application, Right Eye, Once, Tonny Branch, MD Social History: Reviewed -  reports that she has never smoked. She has never used smokeless tobacco.  Review of Systems:   Constitutional: Negative for fever and chills Eyes: Negative for visual disturbances Respiratory: Negative for shortness of breath, dyspnea Cardiovascular: Negative for chest pain or palpitations  Gastrointestinal: Negative for vomiting, diarrhea and constipation; no abdominal pain Genitourinary: Negative for dysuria and urgency, vaginal irritation or  itching Musculoskeletal: Negative for back pain, joint pain, myalgias  Neurological: Negative for dizziness and headaches    Objective Findings:    Physical Examination: Vitals:   01/21/18 0920  BP: (!) 165/66  Pulse: (!) 48   General appearance - well appearing, and in no distress Mental status - alert, oriented to person, place, and time Chest:  Normal respiratory effort Heart - normal rate and regular rhythm Abdomen:  Soft, nontender Pelvic: tender to internal examining hand only along pubococcygeus muscle. DC normal. Uterus/ovaries absent Musculoskeletal:  Normal range of motion without pain Extremities:  No edema    Results for orders placed or performed in visit on 01/21/18 (from the past 24 hour(s))  POC Urinalysis Dipstick OB   Collection Time: 01/21/18  9:53 AM  Result Value Ref Range   Color, UA     Clarity, UA     Glucose, UA Negative (A) (none)   Bilirubin, UA     Ketones, UA neg    Spec Grav, UA  1.010 - 1.025   Blood, UA neg    pH, UA  5.0 - 8.0   POC Protein UA Negative Negative, Trace   Urobilinogen, UA  0.2 or 1.0 E.U./dL   Nitrite, UA neg    Leukocytes, UA Negative Negative   Appearance     Odor        Assessment & Plan:  A:   MSK pain, resolving P:  No tx necessart   Return for If you have any problems.  Joaquim Lai Cresenzo-Dishmon CNM 01/21/2018 10:04 AM

## 2018-01-25 ENCOUNTER — Other Ambulatory Visit (HOSPITAL_COMMUNITY): Payer: Self-pay | Admitting: Internal Medicine

## 2018-01-25 DIAGNOSIS — R928 Other abnormal and inconclusive findings on diagnostic imaging of breast: Secondary | ICD-10-CM

## 2018-01-26 ENCOUNTER — Ambulatory Visit (HOSPITAL_COMMUNITY)
Admission: RE | Admit: 2018-01-26 | Discharge: 2018-01-26 | Disposition: A | Discharge status: Home / Self Care | Payer: PPO | Source: Ambulatory Visit | Attending: Internal Medicine | Admitting: Internal Medicine

## 2018-01-26 DIAGNOSIS — R928 Other abnormal and inconclusive findings on diagnostic imaging of breast: Secondary | ICD-10-CM | POA: Diagnosis not present

## 2018-01-26 DIAGNOSIS — R922 Inconclusive mammogram: Secondary | ICD-10-CM | POA: Diagnosis not present

## 2018-02-03 DIAGNOSIS — Z681 Body mass index (BMI) 19 or less, adult: Secondary | ICD-10-CM | POA: Diagnosis not present

## 2018-02-03 DIAGNOSIS — J329 Chronic sinusitis, unspecified: Secondary | ICD-10-CM | POA: Diagnosis not present

## 2018-02-03 DIAGNOSIS — R42 Dizziness and giddiness: Secondary | ICD-10-CM | POA: Diagnosis not present

## 2018-02-03 DIAGNOSIS — K219 Gastro-esophageal reflux disease without esophagitis: Secondary | ICD-10-CM | POA: Diagnosis not present

## 2018-03-04 DIAGNOSIS — G629 Polyneuropathy, unspecified: Secondary | ICD-10-CM | POA: Diagnosis not present

## 2018-03-04 DIAGNOSIS — G894 Chronic pain syndrome: Secondary | ICD-10-CM | POA: Diagnosis not present

## 2018-03-04 DIAGNOSIS — Z681 Body mass index (BMI) 19 or less, adult: Secondary | ICD-10-CM | POA: Diagnosis not present

## 2018-03-04 DIAGNOSIS — J329 Chronic sinusitis, unspecified: Secondary | ICD-10-CM | POA: Diagnosis not present

## 2018-03-04 DIAGNOSIS — R42 Dizziness and giddiness: Secondary | ICD-10-CM | POA: Diagnosis not present

## 2018-03-04 DIAGNOSIS — M1991 Primary osteoarthritis, unspecified site: Secondary | ICD-10-CM | POA: Diagnosis not present

## 2018-03-08 ENCOUNTER — Other Ambulatory Visit: Payer: Self-pay | Admitting: Gastroenterology

## 2018-03-09 DIAGNOSIS — D510 Vitamin B12 deficiency anemia due to intrinsic factor deficiency: Secondary | ICD-10-CM | POA: Diagnosis not present

## 2018-04-01 ENCOUNTER — Ambulatory Visit (INDEPENDENT_AMBULATORY_CARE_PROVIDER_SITE_OTHER): Payer: PPO | Admitting: Otolaryngology

## 2018-04-01 DIAGNOSIS — H6121 Impacted cerumen, right ear: Secondary | ICD-10-CM

## 2018-04-01 DIAGNOSIS — J343 Hypertrophy of nasal turbinates: Secondary | ICD-10-CM | POA: Diagnosis not present

## 2018-04-01 DIAGNOSIS — J3089 Other allergic rhinitis: Secondary | ICD-10-CM | POA: Diagnosis not present

## 2018-04-12 DIAGNOSIS — E538 Deficiency of other specified B group vitamins: Secondary | ICD-10-CM | POA: Diagnosis not present

## 2018-04-12 DIAGNOSIS — R42 Dizziness and giddiness: Secondary | ICD-10-CM | POA: Diagnosis not present

## 2018-04-12 DIAGNOSIS — G64 Other disorders of peripheral nervous system: Secondary | ICD-10-CM | POA: Diagnosis not present

## 2018-04-12 DIAGNOSIS — Z681 Body mass index (BMI) 19 or less, adult: Secondary | ICD-10-CM | POA: Diagnosis not present

## 2018-05-03 ENCOUNTER — Other Ambulatory Visit (HOSPITAL_COMMUNITY): Payer: Self-pay | Admitting: Internal Medicine

## 2018-05-03 ENCOUNTER — Ambulatory Visit (HOSPITAL_COMMUNITY)
Admission: RE | Admit: 2018-05-03 | Discharge: 2018-05-03 | Disposition: A | Discharge status: Home / Self Care | Payer: PPO | Source: Ambulatory Visit | Attending: Internal Medicine | Admitting: Internal Medicine

## 2018-05-03 DIAGNOSIS — R0602 Shortness of breath: Secondary | ICD-10-CM | POA: Insufficient documentation

## 2018-05-03 DIAGNOSIS — Z681 Body mass index (BMI) 19 or less, adult: Secondary | ICD-10-CM | POA: Diagnosis not present

## 2018-05-03 DIAGNOSIS — R001 Bradycardia, unspecified: Secondary | ICD-10-CM | POA: Diagnosis not present

## 2018-05-03 DIAGNOSIS — E0590 Thyrotoxicosis, unspecified without thyrotoxic crisis or storm: Secondary | ICD-10-CM | POA: Diagnosis not present

## 2018-05-03 DIAGNOSIS — R0600 Dyspnea, unspecified: Secondary | ICD-10-CM | POA: Diagnosis not present

## 2018-05-03 DIAGNOSIS — R5383 Other fatigue: Secondary | ICD-10-CM | POA: Diagnosis not present

## 2018-05-03 DIAGNOSIS — G894 Chronic pain syndrome: Secondary | ICD-10-CM | POA: Diagnosis not present

## 2018-05-05 ENCOUNTER — Encounter: Payer: Self-pay | Admitting: Cardiovascular Disease

## 2018-05-10 ENCOUNTER — Encounter: Payer: Self-pay | Admitting: Nurse Practitioner

## 2018-05-10 ENCOUNTER — Ambulatory Visit (INDEPENDENT_AMBULATORY_CARE_PROVIDER_SITE_OTHER): Payer: PPO | Admitting: Nurse Practitioner

## 2018-05-10 VITALS — BP 141/78 | HR 53 | Temp 97.0°F | Ht 62.0 in | Wt 100.0 lb

## 2018-05-10 DIAGNOSIS — K219 Gastro-esophageal reflux disease without esophagitis: Secondary | ICD-10-CM

## 2018-05-10 DIAGNOSIS — R490 Dysphonia: Secondary | ICD-10-CM | POA: Diagnosis not present

## 2018-05-10 DIAGNOSIS — K143 Hypertrophy of tongue papillae: Secondary | ICD-10-CM | POA: Insufficient documentation

## 2018-05-10 DIAGNOSIS — R499 Unspecified voice and resonance disorder: Secondary | ICD-10-CM | POA: Insufficient documentation

## 2018-05-10 NOTE — Assessment & Plan Note (Signed)
Previously tried Protonix which did not work.  Dexilant was too expensive.  At this point we will cycled through PPIs to see if we can find an option that helps her.  She does not want full month prescriptions until she can try the medication to avoid wasting money.  At this point I will send in a 14-day supply of Nexium.  She is to call us with a progress report in 2 weeks.  We can either adjust medications/dose or provide a prolonged prescription, depending on her clinical response.  Follow-up in 4 months.

## 2018-05-10 NOTE — Progress Notes (Signed)
Referring Provider: [name], MD Primary Care Physician:  [name], MD Primary GI:  Dr. [name]  Chief Complaint  Patient presents with  . burning in throat    also white coating on tongue    HPI:   Robin Preston is a 72 y.o. female who presents for follow-up on throat burning.  The patient was last seen in our office 03/25/2017 for abdominal pain, non-intractable cyclical vomiting with nausea.  History of GERD and laryngeal pharyngeal reflux.  Tried and failed Protonix.  Dexilant was too expensive.  Anemia with unknown Hemoccult status because she did not collect a stool sample.  Declined surveillance colonoscopy.  At her last visit she stated she began having a lot of nausea when she started and biotic for UTI with abdominal pain started next day which became intolerable and stopped after the fifth day of a 10-day course of antibiotics.  No more UTI symptoms.  Plan to see a urologist.  The only thing that improves her pain is lying down and nothing tends to make it worse or trigger it.  Eating worsens her nausea.  She was not eating much at that time.  No other GI symptoms.  Recommended probiotic for 1 to 2 months, Zofran as needed, push fluids, clear liquid diet for the next 2 to 3 days and advance as tolerated.  Follow-up in 4 weeks.  Today she states she's doing fair. Developed a white coating on her tongue (has been on there for months) but hasn't talked to PCP about it. Denies "reflux of stomach contents" but does have throat burning and mouth burning which is essentially constant.  Has to chew chewing gum a lot. Denies abdominal pain, nausea has resolved. Denies vomiting. Denies fever, chills, unintentional weight loss. Last EGD in 2011. Declines endoscopic evaluation. Denies chest pain, dyspnea, dizziness, lightheadedness, syncope, near syncope. Denies any other upper or lower GI symptoms.  Past Medical History:  Diagnosis Date  . Degenerative joint disease    Hands; Rx  -parenteral steroids  . Dizziness 12/2011   Onset in 12/2011  . Dyspnea   . Sinus bradycardia   . Thyroid disease     Past Surgical History:  Procedure Laterality Date  . ABDOMINAL HYSTERECTOMY    . CATARACT EXTRACTION W/PHACO Right 10/04/2012   Procedure: CATARACT EXTRACTION PHACO AND INTRAOCULAR LENS PLACEMENT (IOC);  Surgeon: Tonny Branch, MD;  Location: [facility];  Service: Ophthalmology;  Laterality: Right;  CDE:  15.78  . COLONOSCOPY W/ POLYPECTOMY  01/2010   internal hemorrhoids;9 mm rectal tubular adenoma. next tcs 2018. INADEQUATE CONSCIOUS SEDATION PER PATIENT  . ESOPHAGOGASTRODUODENOSCOPY (EGD) WITH PROPOFOL N/A 04/21/2016   Dr. [name]: Normal esophagus s/p Maloney dilation, unable to exclude occult cervical esophageal web.   Marland Kitchen FINGER ARTHRODESIS Right 06/06/2014   Procedure: ARTHRODESIS RIGHT THUMB INTRAPHALANGEAL JOINT;  Surgeon: Leanora Cover, MD;  Location: [place];  Service: Orthopedics;  Laterality: Right;  . IRRIGATION AND DEBRIDEMENT ABSCESS Left 05/02/2014   Procedure: MINOR INCISION AND DRAINAGE OF LEFT RING FINGER ABSCESS;  Surgeon: Leanora Cover, MD;  Location: [place];  Service: Orthopedics;  Laterality: Left;  I & D LEFT RING FINGER  . 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 MALONEY DILATION N/A 04/21/2016   Procedure: Venia Minks DILATION;  Surgeon: Daneil Dolin, MD;  Location: [facility];  Service: Endoscopy;  Laterality: N/A;  . SKIN CANCER EXCISION     Nose  Current Outpatient Medications  Medication Sig Dispense Refill  . Biotin 10000 MCG TABS Take 10,000 mcg by mouth daily.    . Calcium Carb-Cholecalciferol (CALCIUM 500+D PO) Take 1 tablet by mouth daily.    Marland Kitchen estradiol (ESTRACE) 0.5 MG tablet Take 0.5 mg by mouth daily.    . folic acid (FOLVITE) 785 MCG tablet Take 400 mcg by mouth daily.    Marland Kitchen HYDROcodone-acetaminophen (NORCO/VICODIN) 5-325 MG tablet Take 1 tablet by mouth daily as needed for moderate pain. 30  tablet 0  . meloxicam (MOBIC) 15 MG tablet TK 1 T PO D  11  . methimazole (TAPAZOLE) 10 MG tablet Take 10 mg by mouth 2 (two) times daily.     . Omega-3 Fatty Acids (OMEGA 3 PO) Take 1 capsule by mouth daily.    . pantoprazole (PROTONIX) 40 MG tablet TAKE 1 TABLET(40 MG) BY MOUTH TWICE DAILY 60 tablet 5   No current facility-administered medications for this visit.    Facility-Administered Medications Ordered in Other Visits  Medication Dose Route Frequency Provider Last Rate Last Dose  . lidocaine hcl (ophth) (AKTEN) 3.5 % ophthalmic gel 1 application  1 application Right Eye Once Tonny Branch, MD        Allergies as of 05/10/2018 - Review Complete 05/10/2018  Allergen Reaction Noted  . Levaquin [levofloxacin] Nausea And Vomiting 03/25/2017  . Sulfa antibiotics Rash 09/21/2011    Family History  Problem Relation Age of Onset  . Non-Hodgkin's lymphoma Sister   . Lung cancer Brother   . Colon cancer Neg Hx     Social History   Socioeconomic History  . Marital status: Divorced    Spouse name: Not on file  . Number of children: Not on file  . Years of education: Not on file  . Highest education level: Not on file  Occupational History  . Not on file  Social Needs  . Financial resource strain: Not on file  . Food insecurity:    Worry: Not on file    Inability: Not on file  . Transportation needs:    Medical: Not on file    Non-medical: Not on file  Tobacco Use  . Smoking status: Never Smoker  . Smokeless tobacco: Never Used  Substance and Sexual Activity  . Alcohol use: No  . Drug use: No  . Sexual activity: Not Currently    Birth control/protection: Surgical  Lifestyle  . Physical activity:    Days per week: Not on file    Minutes per session: Not on file  . Stress: Not on file  Relationships  . Social connections:    Talks on phone: Not on file    Gets together: Not on file    Attends religious service: Not on file    Active member of club or organization:  Not on file    Attends meetings of clubs or organizations: Not on file    Relationship status: Not on file  Other Topics Concern  . Not on file  Social History Narrative  . Not on file    Review of Systems: General: Negative for anorexia, weight loss, fever, chills, fatigue, weakness. Mouth: No tongue coating noted. ENT: Negative for hoarseness, difficulty swallowing. CV: Negative for chest pain, angina, palpitations, peripheral edema.  Respiratory: Negative for dyspnea at rest, cough, sputum, wheezing.  GI: See history of present illness. Endo: Negative for unusual weight change.  Heme: Negative for bruising or bleeding.   Physical Exam: BP (!) 141/78  Pulse (!) 53   Temp (!) 97 F (36.1 C) (Oral)   Ht 5\' 2"  (1.575 m)   Wt 100 lb (45.4 kg)   BMI 18.29 kg/m  General:   Alert and oriented. Pleasant and cooperative. Well-nourished and well-developed.  Eyes:  Without icterus, sclera clear and conjunctiva pink.  Ears:  Normal auditory acuity. Cardiovascular:  S1, S2 present without murmurs appreciated. Extremities without clubbing or edema. Respiratory:  Clear to auscultation bilaterally. No wheezes, rales, or rhonchi. No distress.  Gastrointestinal:  +BS, soft, non-tender and non-distended. No HSM noted. No guarding or rebound. No masses appreciated.  Rectal:  Deferred  Musculoskalatal:  Symmetrical without gross deformities. Neurologic:  Alert and oriented x4;  grossly normal neurologically. Psych:  Alert and cooperative. Normal mood and affect. Heme/Lymph/Immune: No excessive bruising noted.    05/10/2018 11:27 AM   Disclaimer: This note was dictated with voice recognition software. Similar sounding words can inadvertently be transcribed and may not be corrected upon review.

## 2018-05-10 NOTE — Patient Instructions (Signed)
1. I will send a prescription for Nexium to your pharmacy.  Take this once a day for 14 days.  Call us in 2 weeks and let us know if it is helping throat/mouth burning symptoms. 2. We can make changes to your medication based on how you respond to Nexium. 3. As we discussed, discussed the white coating on your tongue with your primary care in 2 days. 4. Return for follow-up in 4 months. 5. Call us if he have any questions or concerns.  At [facility] we value your feedback. You may receive a survey about your visit today. Please share your experience as we strive to create trusting relationships with our patients to provide genuine, compassionate, quality care.  We appreciate your understanding and patience as we review any laboratory studies, imaging, and other diagnostic tests that are ordered as we care for you. Our office policy is 5 business days for review of these results, and any emergent or urgent results are addressed in a timely manner for your best interest. If you do not hear from our office in 1 week, please contact us.   We also encourage the use of MyChart, which contains your medical information for your review as well. If you are not enrolled in this feature, an access code is on this after visit summary for your convenience. Thank you for allowing [place] to be involved in your care.  It was great to meet you today!  I hope things get better for you soon!!

## 2018-05-10 NOTE — Assessment & Plan Note (Signed)
The patient notes a white coating on her tongue every morning.  She typically scrapes this off.  I examined her oral cavity today with no scrappable coating noted, although she states she scraped it off this morning.  She has a appointment with her primary care in 2 days for a B12 injection.  I recommended that she not brush her tongue at that morning and have them examine it.  If she does have thrush she can have topical treatment versus oral Diflucan.  Follow-up as needed.

## 2018-05-10 NOTE — Assessment & Plan Note (Signed)
Notes changes in her voice making it difficult to sing at church.  This is in the setting of laryngopharyngeal reflux/GERD symptoms as per below.  I feel if we can get her symptoms under control her voice quality should improve.  If not we can we refer back to ENT.  Return for follow-up in 4 months.

## 2018-05-10 NOTE — Progress Notes (Signed)
CC'D TO PCP °

## 2018-05-11 ENCOUNTER — Telehealth: Payer: Self-pay | Admitting: Internal Medicine

## 2018-05-11 DIAGNOSIS — R112 Nausea with vomiting, unspecified: Secondary | ICD-10-CM

## 2018-05-11 DIAGNOSIS — K219 Gastro-esophageal reflux disease without esophagitis: Secondary | ICD-10-CM

## 2018-05-11 NOTE — Telephone Encounter (Signed)
Pt was here yesterday to see EG and she said the prescription he was putting her on still hasn't been called into the pharmacy. She uses [facility] on [address].

## 2018-05-11 NOTE — Telephone Encounter (Signed)
Spoke with pt. She is waiting on RX Nexium sent to her pharmacy.

## 2018-05-12 ENCOUNTER — Encounter: Payer: Self-pay | Admitting: Cardiology

## 2018-05-12 DIAGNOSIS — Z681 Body mass index (BMI) 19 or less, adult: Secondary | ICD-10-CM | POA: Diagnosis not present

## 2018-05-12 DIAGNOSIS — E538 Deficiency of other specified B group vitamins: Secondary | ICD-10-CM | POA: Diagnosis not present

## 2018-05-12 DIAGNOSIS — Z1389 Encounter for screening for other disorder: Secondary | ICD-10-CM | POA: Diagnosis not present

## 2018-05-12 DIAGNOSIS — K219 Gastro-esophageal reflux disease without esophagitis: Secondary | ICD-10-CM | POA: Diagnosis not present

## 2018-05-12 DIAGNOSIS — B370 Candidal stomatitis: Secondary | ICD-10-CM | POA: Diagnosis not present

## 2018-05-12 MED ORDER — ESOMEPRAZOLE MAGNESIUM 40 MG PO PACK: 40.0000 mg | PACK | Freq: Every day | ORAL | 0 refills | Status: DC

## 2018-05-12 NOTE — Telephone Encounter (Signed)
Pt notified that RX was sent to her pharmacy. Pt said a PA was needed for the 2 week supply of Nexium. Samples are ready for pickup ok per EG. Pt will let us know in 2 weeks how medication worked.

## 2018-05-12 NOTE — Addendum Note (Signed)
Addended by: Gordy Levan, ERIC A on: 05/12/2018 09:12 AM   Modules accepted: Orders

## 2018-05-12 NOTE — Telephone Encounter (Signed)
Sorry, for some reason it didn't go through. I have sent an Rx to the pharmacy for 14 days with requested progress report in 2 weeks.

## 2018-05-13 ENCOUNTER — Telehealth: Payer: Self-pay | Admitting: *Deleted

## 2018-05-13 NOTE — Telephone Encounter (Signed)
Pt was given a two week supply of Nexium by EG on 05/12/18. PA isn't needed at this time.

## 2018-05-13 NOTE — Telephone Encounter (Signed)
Routing message 

## 2018-05-13 NOTE — Telephone Encounter (Signed)
Received fax from [facility] stating plan does not cover future refills on Nexium 40mg  pwder pkt. Please change to capsules or call [phone] to initiate PA.

## 2018-05-13 NOTE — Telephone Encounter (Signed)
noted 

## 2018-06-14 DIAGNOSIS — Z681 Body mass index (BMI) 19 or less, adult: Secondary | ICD-10-CM | POA: Diagnosis not present

## 2018-06-14 DIAGNOSIS — R350 Frequency of micturition: Secondary | ICD-10-CM | POA: Diagnosis not present

## 2018-06-14 DIAGNOSIS — E538 Deficiency of other specified B group vitamins: Secondary | ICD-10-CM | POA: Diagnosis not present

## 2018-06-28 DIAGNOSIS — G894 Chronic pain syndrome: Secondary | ICD-10-CM | POA: Diagnosis not present

## 2018-06-28 DIAGNOSIS — R8290 Unspecified abnormal findings in urine: Secondary | ICD-10-CM | POA: Diagnosis not present

## 2018-06-28 DIAGNOSIS — Z681 Body mass index (BMI) 19 or less, adult: Secondary | ICD-10-CM | POA: Diagnosis not present

## 2018-06-28 DIAGNOSIS — R319 Hematuria, unspecified: Secondary | ICD-10-CM | POA: Diagnosis not present

## 2018-07-05 ENCOUNTER — Ambulatory Visit: Payer: PPO | Admitting: Cardiovascular Disease

## 2018-07-15 DIAGNOSIS — E538 Deficiency of other specified B group vitamins: Secondary | ICD-10-CM | POA: Diagnosis not present

## 2018-08-16 DIAGNOSIS — Z0000 Encounter for general adult medical examination without abnormal findings: Secondary | ICD-10-CM | POA: Diagnosis not present

## 2018-08-16 DIAGNOSIS — E538 Deficiency of other specified B group vitamins: Secondary | ICD-10-CM | POA: Diagnosis not present

## 2018-08-16 DIAGNOSIS — Z1389 Encounter for screening for other disorder: Secondary | ICD-10-CM | POA: Diagnosis not present

## 2018-09-07 DIAGNOSIS — G894 Chronic pain syndrome: Secondary | ICD-10-CM | POA: Diagnosis not present

## 2018-09-07 DIAGNOSIS — Z1389 Encounter for screening for other disorder: Secondary | ICD-10-CM | POA: Diagnosis not present

## 2018-09-07 DIAGNOSIS — Z681 Body mass index (BMI) 19 or less, adult: Secondary | ICD-10-CM | POA: Diagnosis not present

## 2018-09-07 DIAGNOSIS — E538 Deficiency of other specified B group vitamins: Secondary | ICD-10-CM | POA: Diagnosis not present

## 2018-09-08 ENCOUNTER — Ambulatory Visit: Payer: PPO | Admitting: Nurse Practitioner

## 2018-10-26 DIAGNOSIS — E0590 Thyrotoxicosis, unspecified without thyrotoxic crisis or storm: Secondary | ICD-10-CM | POA: Diagnosis not present

## 2018-10-26 DIAGNOSIS — R946 Abnormal results of thyroid function studies: Secondary | ICD-10-CM | POA: Diagnosis not present

## 2018-10-26 DIAGNOSIS — M1991 Primary osteoarthritis, unspecified site: Secondary | ICD-10-CM | POA: Diagnosis not present

## 2018-10-26 DIAGNOSIS — Z681 Body mass index (BMI) 19 or less, adult: Secondary | ICD-10-CM | POA: Diagnosis not present

## 2018-10-26 DIAGNOSIS — G894 Chronic pain syndrome: Secondary | ICD-10-CM | POA: Diagnosis not present

## 2018-10-26 DIAGNOSIS — L4051 Distal interphalangeal psoriatic arthropathy: Secondary | ICD-10-CM | POA: Diagnosis not present

## 2018-11-18 DIAGNOSIS — F419 Anxiety disorder, unspecified: Secondary | ICD-10-CM | POA: Diagnosis not present

## 2018-11-18 DIAGNOSIS — Z681 Body mass index (BMI) 19 or less, adult: Secondary | ICD-10-CM | POA: Diagnosis not present

## 2018-11-18 DIAGNOSIS — L4051 Distal interphalangeal psoriatic arthropathy: Secondary | ICD-10-CM | POA: Diagnosis not present

## 2018-11-18 DIAGNOSIS — M1991 Primary osteoarthritis, unspecified site: Secondary | ICD-10-CM | POA: Diagnosis not present

## 2018-11-18 DIAGNOSIS — E0590 Thyrotoxicosis, unspecified without thyrotoxic crisis or storm: Secondary | ICD-10-CM | POA: Diagnosis not present

## 2018-11-18 DIAGNOSIS — Z1389 Encounter for screening for other disorder: Secondary | ICD-10-CM | POA: Diagnosis not present

## 2018-11-18 DIAGNOSIS — R319 Hematuria, unspecified: Secondary | ICD-10-CM | POA: Diagnosis not present

## 2018-11-18 DIAGNOSIS — G894 Chronic pain syndrome: Secondary | ICD-10-CM | POA: Diagnosis not present

## 2018-11-18 DIAGNOSIS — R946 Abnormal results of thyroid function studies: Secondary | ICD-10-CM | POA: Diagnosis not present

## 2018-11-18 DIAGNOSIS — M2550 Pain in unspecified joint: Secondary | ICD-10-CM | POA: Diagnosis not present

## 2018-11-23 NOTE — Progress Notes (Signed)
Primary Care Physician: [name], MD  Primary Gastroenterologist:  Garfield Cornea, MD   Chief Complaint  Patient presents with  . Gastroesophageal Reflux    Trouble swallowing    HPI: Robin Preston is a 73 y.o. female here for follow up. She was last seen in 04/2018. H/o throat burning, GERD, LPR.   Recently tried and failed Protonix.  Dexilant too expensive.  Was started on Nexium after last office visit, she took for couple weeks but really did not find any relief.  Subsequently started on omeprazole 20 mg daily, over-the-counter at the suggestion of her niece.  Patient has declined surveillance colonoscopy. Last TCS 2011 with tubular adenoma removed. EGD 2017 with normal esophagus s/p dilation.  On omeprazole, she has noted decreased issues with regurgitation.  Food really is not coming up anymore.  Continues to have burning up in the throat area, mouth, tongue.  Constantly has the urge to swallow.  Feels like there is some drainage in the back of her throat although she denies any nasal congestion.  Large pills give her difficulty swallowing.  Seem to get stuck in her throat and she has to wash them down.  Sometimes feels like they are stuck in the middle up or down.  Denies any problems swallowing food however.  Tolerates bread and meat.  Weight is stable. BM every day. No melena, brbpr.  Mouth and throat symptoms, she is experienced these in the past and felt to have LPR.  Currently her symptoms have been going every few months.  Recently on doxycycline for urinary tract infection per patient.  Took 2 doses and developed vomiting.  She was switched to Cipro which she is going to be on for 7 days.  Current Outpatient Medications  Medication Sig Dispense Refill  . Biotin 10000 MCG TABS Take 10,000 mcg by mouth daily.    . Calcium Carb-Cholecalciferol (CALCIUM 500+D PO) Take 1 tablet by mouth daily.    Marland Kitchen estradiol (ESTRACE) 0.5 MG tablet Take 0.5 mg by mouth daily.    .  folic acid (FOLVITE) 470 MCG tablet Take 400 mcg by mouth daily.    Marland Kitchen HYDROcodone-acetaminophen (NORCO/VICODIN) 5-325 MG tablet Take 1 tablet by mouth daily as needed for moderate pain. 30 tablet 0  . meloxicam (MOBIC) 15 MG tablet TK 1 T PO D  11  . methimazole (TAPAZOLE) 10 MG tablet Take 10 mg by mouth 2 (two) times daily.     Marland Kitchen omeprazole (PRILOSEC) 20 MG capsule Take 20 mg by mouth daily.     No current facility-administered medications for this visit.    Facility-Administered Medications Ordered in Other Visits  Medication Dose Route Frequency Provider Last Rate Last Dose  . lidocaine hcl (ophth) (AKTEN) 3.5 % ophthalmic gel 1 application  1 application Right Eye Once Tonny Branch, MD        Allergies as of 11/24/2018 - Review Complete 11/24/2018  Allergen Reaction Noted  . Levaquin [levofloxacin] Nausea And Vomiting 03/25/2017  . Sulfa antibiotics Rash 09/21/2011    ROS:  General: Negative for anorexia, weight loss, fever, chills, fatigue, weakness. ENT: Negative for nasal congestion.  See HPI CV: Negative for chest pain, angina, palpitations, dyspnea on exertion, peripheral edema.  Respiratory: Negative for dyspnea at rest, dyspnea on exertion, cough, sputum, wheezing.  GI: See history of present illness. GU:  Negative for dysuria, hematuria, urinary incontinence, urinary frequency, nocturnal urination.  Endo: Negative for unusual weight change.    Physical  Examination:   BP (!) 150/65   Pulse (!) 55   Temp (!) 96.4 F (35.8 C)   Ht 5' 1.5" (1.562 m)   Wt 98 lb 3.2 oz (44.5 kg)   BMI 18.25 kg/m   General: Well-nourished, well-developed in no acute distress.  Eyes: No icterus. Mouth: Oropharyngeal mucosa moist and pink , no lesions erythema or exudate. Lungs: Clear to auscultation bilaterally.  Heart: Regular rate and rhythm, no murmurs rubs or gallops.  Abdomen: Bowel sounds are normal, nontender, nondistended, no hepatosplenomegaly or masses, no abdominal bruits  or hernia , no rebound or guarding.   Extremities: No lower extremity edema. No clubbing or deformities. Neuro: Alert and oriented x 4   Skin: Warm and dry, no jaundice.   Psych: Alert and cooperative, normal mood and affect.

## 2018-11-24 ENCOUNTER — Other Ambulatory Visit: Payer: Self-pay

## 2018-11-24 ENCOUNTER — Ambulatory Visit (INDEPENDENT_AMBULATORY_CARE_PROVIDER_SITE_OTHER): Payer: PPO | Admitting: Gastroenterology

## 2018-11-24 ENCOUNTER — Encounter: Payer: Self-pay | Admitting: Gastroenterology

## 2018-11-24 VITALS — BP 150/65 | HR 55 | Temp 96.4°F | Ht 61.5 in | Wt 98.2 lb

## 2018-11-24 DIAGNOSIS — K219 Gastro-esophageal reflux disease without esophagitis: Secondary | ICD-10-CM

## 2018-11-24 NOTE — Patient Instructions (Signed)
1. Increase omeprazole to 20 mg, 30 minutes before breakfast and 20 mg, 30 minutes before your evening meal. 2. Call me in 2 weeks and let me know if you are mouth and throat symptoms are improved.  You should notice some improvement although it will take much longer for symptoms to completely resolve if they are related to acid reflux. 3. If you continue to have swallowing concerns, would offer you x-ray of your esophagus as discussed today. 4. Return to the office in 4 months or call sooner if needed.   Food Choices for Gastroesophageal Reflux Disease, Adult When you have gastroesophageal reflux disease (GERD), the foods you eat and your eating habits are very important. Choosing the right foods can help ease the discomfort of GERD. Consider working with a diet and nutrition specialist (dietitian) to help you make healthy food choices. What general guidelines should I follow?  Eating plan  Choose healthy foods low in fat, such as fruits, vegetables, whole grains, low-fat dairy products, and lean meat, fish, and poultry.  Eat frequent, small meals instead of three large meals each day. Eat your meals slowly, in a relaxed setting. Avoid bending over or lying down until 2-3 hours after eating.  Limit high-fat foods such as fatty meats or fried foods.  Limit your intake of oils, butter, and shortening to less than 8 teaspoons each day.  Avoid the following: ? Foods that cause symptoms. These may be different for different people. Keep a food diary to keep track of foods that cause symptoms. ? Alcohol. ? Drinking large amounts of liquid with meals. ? Eating meals during the 2-3 hours before bed.  Cook foods using methods other than frying. This may include baking, grilling, or broiling. Lifestyle  Maintain a healthy weight. Ask your health care provider what weight is healthy for you. If you need to lose weight, work with your health care provider to do so safely.  Exercise for at least  30 minutes on 5 or more days each week, or as told by your health care provider.  Avoid wearing clothes that fit tightly around your waist and chest.  Do not use any products that contain nicotine or tobacco, such as cigarettes and e-cigarettes. If you need help quitting, ask your health care provider.  Sleep with the head of your bed raised. Use a wedge under the mattress or blocks under the bed frame to raise the head of the bed. What foods are not recommended? The items listed may not be a complete list. Talk with your dietitian about what dietary choices are best for you. Grains Pastries or quick breads with added fat. [place] toast. Vegetables Deep fried vegetables. [place] fries. Any vegetables prepared with added fat. Any vegetables that cause symptoms. For some people this may include tomatoes and tomato products, chili peppers, onions and garlic, and horseradish. Fruits Any fruits prepared with added fat. Any fruits that cause symptoms. For some people this may include citrus fruits, such as oranges, grapefruit, pineapple, and lemons. Meats and other protein foods High-fat meats, such as fatty beef or pork, hot dogs, ribs, ham, sausage, salami and bacon. Fried meat or protein, including fried fish and fried chicken. Nuts and nut butters. Dairy Whole milk and chocolate milk. Sour cream. Cream. Ice cream. Cream cheese. Milk shakes. Beverages Coffee and tea, with or without caffeine. Carbonated beverages. Sodas. Energy drinks. Fruit juice made with acidic fruits (such as orange or grapefruit). Tomato juice. Alcoholic drinks. Fats and oils Butter. Margarine.  Shortening. Ghee. Sweets and desserts Chocolate and cocoa. Donuts. Seasoning and other foods Pepper. Peppermint and spearmint. Any condiments, herbs, or seasonings that cause symptoms. For some people, this may include curry, hot sauce, or vinegar-based salad dressings. Summary  When you have gastroesophageal reflux disease  (GERD), food and lifestyle choices are very important to help ease the discomfort of GERD.  Eat frequent, small meals instead of three large meals each day. Eat your meals slowly, in a relaxed setting. Avoid bending over or lying down until 2-3 hours after eating.  Limit high-fat foods such as fatty meat or fried foods. This information is not intended to replace advice given to you by your health care provider. Make sure you discuss any questions you have with your health care provider. Document Released: 06/09/2005 Document Revised: 06/10/2016 Document Reviewed: 06/10/2016 Elsevier Interactive Patient Education  2019 [employer].  Gastroesophageal Reflux Disease, Adult Gastroesophageal reflux (GER) happens when acid from the stomach flows up into the tube that connects the mouth and the stomach (esophagus). Normally, food travels down the esophagus and stays in the stomach to be digested. However, when a person has GER, food and stomach acid sometimes move back up into the esophagus. If this becomes a more serious problem, the person may be diagnosed with a disease called gastroesophageal reflux disease (GERD). GERD occurs when the reflux:  Happens often.  Causes frequent or severe symptoms.  Causes problems such as damage to the esophagus. When stomach acid comes in contact with the esophagus, the acid may cause soreness (inflammation) in the esophagus. Over time, GERD may create small holes (ulcers) in the lining of the esophagus. What are the causes? This condition is caused by a problem with the muscle between the esophagus and the stomach (lower esophageal sphincter, or LES). Normally, the LES muscle closes after food passes through the esophagus to the stomach. When the LES is weakened or abnormal, it does not close properly, and that allows food and stomach acid to go back up into the esophagus. The LES can be weakened by certain dietary substances, medicines, and medical conditions,  including:  Tobacco use.  Pregnancy.  Having a hiatal hernia.  Alcohol use.  Certain foods and beverages, such as coffee, chocolate, onions, and peppermint. What increases the risk? You are more likely to develop this condition if you:  Have an increased body weight.  Have a connective tissue disorder.  Use NSAID medicines. What are the signs or symptoms? Symptoms of this condition include:  Heartburn.  Difficult or painful swallowing.  The feeling of having a lump in the throat.  Abitter taste in the mouth.  Bad breath.  Having a large amount of saliva.  Having an upset or bloated stomach.  Belching.  Chest pain. Different conditions can cause chest pain. Make sure you see your health care provider if you experience chest pain.  Shortness of breath or wheezing.  Ongoing (chronic) cough or a night-time cough.  Wearing away of tooth enamel.  Weight loss. How is this diagnosed? Your health care provider will take a medical history and perform a physical exam. To determine if you have mild or severe GERD, your health care provider may also monitor how you respond to treatment. You may also have tests, including:  A test to examine your stomach and esophagus with a small camera (endoscopy).  A test thatmeasures the acidity level in your esophagus.  A test thatmeasures how much pressure is on your esophagus.  A barium swallow  or modified barium swallow test to show the shape, size, and functioning of your esophagus. How is this treated? The goal of treatment is to help relieve your symptoms and to prevent complications. Treatment for this condition may vary depending on how severe your symptoms are. Your health care provider may recommend:  Changes to your diet.  Medicine.  Surgery. Follow these instructions at home: Eating and drinking   Follow a diet as recommended by your health care provider. This may involve avoiding foods and drinks such  as: ? Coffee and tea (with or without caffeine). ? Drinks that containalcohol. ? Energy drinks and sports drinks. ? Carbonated drinks or sodas. ? Chocolate and cocoa. ? Peppermint and mint flavorings. ? Garlic and onions. ? Horseradish. ? Spicy and acidic foods, including peppers, chili powder, curry powder, vinegar, hot sauces, and barbecue sauce. ? Citrus fruit juices and citrus fruits, such as oranges, lemons, and limes. ? Tomato-based foods, such as red sauce, chili, salsa, and pizza with red sauce. ? Fried and fatty foods, such as donuts, french fries, potato chips, and high-fat dressings. ? High-fat meats, such as hot dogs and fatty cuts of red and white meats, such as rib eye steak, sausage, ham, and bacon. ? High-fat dairy items, such as whole milk, butter, and cream cheese.  Eat small, frequent meals instead of large meals.  Avoid drinking large amounts of liquid with your meals.  Avoid eating meals during the 2-3 hours before bedtime.  Avoid lying down right after you eat.  Do not exercise right after you eat. Lifestyle   Do not use any products that contain nicotine or tobacco, such as cigarettes, e-cigarettes, and chewing tobacco. If you need help quitting, ask your health care provider.  Try to reduce your stress by using methods such as yoga or meditation. If you need help reducing stress, ask your health care provider.  If you are overweight, reduce your weight to an amount that is healthy for you. Ask your health care provider for guidance about a safe weight loss goal. General instructions  Pay attention to any changes in your symptoms.  Take over-the-counter and prescription medicines only as told by your health care provider. Do not take aspirin, ibuprofen, or other NSAIDs unless your health care provider told you to do so.  Wear loose-fitting clothing. Do not wear anything tight around your waist that causes pressure on your abdomen.  Raise (elevate) the  head of your bed about 6 inches (15 cm).  Avoid bending over if this makes your symptoms worse.  Keep all follow-up visits as told by your health care provider. This is important. Contact a health care provider if:  You have: ? New symptoms. ? Unexplained weight loss. ? Difficulty swallowing or it hurts to swallow. ? Wheezing or a persistent cough. ? A hoarse voice.  Your symptoms do not improve with treatment. Get help right away if you:  Have pain in your arms, neck, jaw, teeth, or back.  Feel sweaty, dizzy, or light-headed.  Have chest pain or shortness of breath.  Vomit and your vomit looks like blood or coffee grounds.  Faint.  Have stool that is bloody or black.  Cannot swallow, drink, or eat. Summary  Gastroesophageal reflux happens when acid from the stomach flows up into the esophagus. GERD is a disease in which the reflux happens often, causes frequent or severe symptoms, or causes problems such as damage to the esophagus.  Treatment for this condition may vary depending  on how severe your symptoms are. Your health care provider may recommend diet and lifestyle changes, medicine, or surgery.  Contact a health care provider if you have new or worsening symptoms.  Take over-the-counter and prescription medicines only as told by your health care provider. Do not take aspirin, ibuprofen, or other NSAIDs unless your health care provider told you to do so.  Keep all follow-up visits as told by your health care provider. This is important. This information is not intended to replace advice given to you by your health care provider. Make sure you discuss any questions you have with your health care provider. Document Released: 03/19/2005 Document Revised: 12/16/2017 Document Reviewed: 12/16/2017 Elsevier Interactive Patient Education  2019 [employer].

## 2018-11-24 NOTE — Assessment & Plan Note (Signed)
Typical heartburn better controlled on omeprazole 20 mg daily.  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  Has remotely seen ENT.  Complains of dysphagia to certain pills but does well with solid foods including bread and meat.  She is not interested in pursuing endoscopy at this time.  We discussed that her symptoms may be LPR related, would consider increasing omeprazole to 20 mg twice daily before meals.  She is aware that can take several months for significant improvement in the symptoms but she should notice some gradual improvement over the course of several weeks.  If no significant improvement noted, would offer her barium pill esophagram as next step to evaluate for stricture, Zenker's.  She will return to the office in 4 months.  She will call sooner if needed.  reinforced antireflux measures.

## 2018-11-24 NOTE — Progress Notes (Signed)
cc'ed to pcp °

## 2018-12-17 ENCOUNTER — Other Ambulatory Visit: Payer: Self-pay | Admitting: Gastroenterology

## 2019-01-05 DIAGNOSIS — L03114 Cellulitis of left upper limb: Secondary | ICD-10-CM | POA: Diagnosis not present

## 2019-01-05 DIAGNOSIS — L255 Unspecified contact dermatitis due to plants, except food: Secondary | ICD-10-CM | POA: Diagnosis not present

## 2019-01-05 DIAGNOSIS — Z1389 Encounter for screening for other disorder: Secondary | ICD-10-CM | POA: Diagnosis not present

## 2019-01-05 DIAGNOSIS — Z681 Body mass index (BMI) 19 or less, adult: Secondary | ICD-10-CM | POA: Diagnosis not present

## 2019-01-05 DIAGNOSIS — K219 Gastro-esophageal reflux disease without esophagitis: Secondary | ICD-10-CM | POA: Diagnosis not present

## 2019-01-05 DIAGNOSIS — M1991 Primary osteoarthritis, unspecified site: Secondary | ICD-10-CM | POA: Diagnosis not present

## 2019-01-18 DIAGNOSIS — J22 Unspecified acute lower respiratory infection: Secondary | ICD-10-CM | POA: Diagnosis not present

## 2019-01-18 DIAGNOSIS — Z681 Body mass index (BMI) 19 or less, adult: Secondary | ICD-10-CM | POA: Diagnosis not present

## 2019-01-18 DIAGNOSIS — R070 Pain in throat: Secondary | ICD-10-CM | POA: Diagnosis not present

## 2019-03-03 ENCOUNTER — Ambulatory Visit (INDEPENDENT_AMBULATORY_CARE_PROVIDER_SITE_OTHER): Payer: PPO | Admitting: Otolaryngology

## 2019-03-03 DIAGNOSIS — H9209 Otalgia, unspecified ear: Secondary | ICD-10-CM | POA: Diagnosis not present

## 2019-03-03 DIAGNOSIS — H903 Sensorineural hearing loss, bilateral: Secondary | ICD-10-CM

## 2019-03-03 DIAGNOSIS — H6121 Impacted cerumen, right ear: Secondary | ICD-10-CM

## 2019-03-08 DIAGNOSIS — Z681 Body mass index (BMI) 19 or less, adult: Secondary | ICD-10-CM | POA: Diagnosis not present

## 2019-03-08 DIAGNOSIS — F419 Anxiety disorder, unspecified: Secondary | ICD-10-CM | POA: Diagnosis not present

## 2019-03-08 DIAGNOSIS — Z23 Encounter for immunization: Secondary | ICD-10-CM | POA: Diagnosis not present

## 2019-03-08 DIAGNOSIS — G894 Chronic pain syndrome: Secondary | ICD-10-CM | POA: Diagnosis not present

## 2019-03-10 DIAGNOSIS — N342 Other urethritis: Secondary | ICD-10-CM | POA: Diagnosis not present

## 2019-03-10 DIAGNOSIS — Z681 Body mass index (BMI) 19 or less, adult: Secondary | ICD-10-CM | POA: Diagnosis not present

## 2019-03-31 ENCOUNTER — Ambulatory Visit: Payer: PPO | Admitting: Gastroenterology

## 2019-03-31 ENCOUNTER — Ambulatory Visit: Payer: PPO | Admitting: Nurse Practitioner

## 2019-04-21 ENCOUNTER — Encounter: Payer: Self-pay | Admitting: Nurse Practitioner

## 2019-04-21 ENCOUNTER — Ambulatory Visit (INDEPENDENT_AMBULATORY_CARE_PROVIDER_SITE_OTHER): Payer: PPO | Admitting: Nurse Practitioner

## 2019-04-21 ENCOUNTER — Other Ambulatory Visit: Payer: Self-pay

## 2019-04-21 DIAGNOSIS — K219 Gastro-esophageal reflux disease without esophagitis: Secondary | ICD-10-CM

## 2019-04-21 DIAGNOSIS — R1310 Dysphagia, unspecified: Secondary | ICD-10-CM | POA: Diagnosis not present

## 2019-04-21 MED ORDER — SUCRALFATE 1 G PO TABS: 1.0000 g | ORAL_TABLET | Freq: Two times a day (BID) | ORAL | 5 refills | Status: DC | PRN

## 2019-04-21 NOTE — Assessment & Plan Note (Signed)
Occasional pill dysphagia. Continues to decline endoscopic evaluation. Monitor and notify if any worsening.

## 2019-04-21 NOTE — Patient Instructions (Addendum)
Your health issues we discussed today were:   Reflux:   1. Protonix twice a day 2. I have sent in a prescription for Carafate to your pharmacy.  This will be in the form of of small tablets that you can crush and mix with a little bit of water to make a slurry.  Take it twice a day, as needed for throat burning 3. Call us if you have any worsening or severe symptoms  Overall I recommend:  1. Continue taking your current medications 2. Call us to schedule a follow-up in 2 months 3. Call us if you have any questions or concerns.   Because of recent events of COVID-19 ("Coronavirus"), follow CDC recommendations:  Wash your hand frequently Avoid touching your face Stay away from people who are sick If you have symptoms such as fever, cough, shortness of breath then call your healthcare provider for further guidance If you are sick, STAY AT HOME unless otherwise directed by your healthcare provider. Follow directions from state and national officials regarding staying safe   At [facility] we value your feedback. You may receive a survey about your visit today. Please share your experience as we strive to create trusting relationships with our patients to provide genuine, compassionate, quality care.  We appreciate your understanding and patience as we review any laboratory studies, imaging, and other diagnostic tests that are ordered as we care for you. Our office policy is 5 business days for review of these results, and any emergent or urgent results are addressed in a timely manner for your best interest. If you do not hear from our office in 1 week, please contact us.   We also encourage the use of MyChart, which contains your medical information for your review as well. If you are not enrolled in this feature, an access code is on this after visit summary for your convenience. Thank you for allowing [place] to be involved in your care.  It was great to see you today!  I hope  you have a great Fall and Happy Halloween!!

## 2019-04-21 NOTE — Progress Notes (Signed)
Referring Provider: [name], MD Primary Care Physician:  [name], MD Primary GI:  Dr. [name]  NOTE: Service was provided via telemedicine and was requested by the patient due to COVID-19 pandemic.  Method of visit: Telephone  Patient Location: Home  Provider Location: Office  Reason for Phone Visit: Follow-up  The patient was consented to phone follow-up via telephone encounter including billing of the encounter (yes/no): Yes  Persons present on the phone encounter, with roles: None  Total time (minutes) spent on medical discussion: 26 minutes  Chief Complaint  Patient presents with  . laryngopharyngeal reflux    throat/mouth always seems to be burning, reports nothing we are doing is helping per pt    HPI:   Robin Preston is a 73 y.o. 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  Patient was last seen in our office 11/24/2018 for GERD and difficulty swallowing.  History of throat burning, GERD, LPR.  Recently tried [employer], Dexilant too expensive, Nexium ineffective.  Previously declined surveillance colonoscopy and last TCS in 2011 tubular adenoma.  EGD last completed 2017 with normal esophagus status post dilation.  At her last visit she noted improved regurgitation on omeprazole but continues with throat burning.  Feels like there is drainage in her posterior pharynx.  Having issues swallowing large pills and requires copious fluids to get in the past.  No solid food dysphagia.  Recently started doxycycline for UTI which caused vomiting and she was switched to Cipro.  Recommended increase omeprazole to 20 mg twice daily, follow-up in 2 weeks by phone for symptom improvement, if continued swallowing concerns would offer esophageal imaging.  Follow-up in 4 months.  Declined endoscopy at her last visit.  Today she states she's doing ok overall. Her reflux is intermittently better, some days good and some days not so  much. She did have some improvement with bid PPI. She has switched to Protonix bid (cheaper and more effective than Prilosec). Biggest symptom is a throat burning sensation. Denies significant bitter/sour taste. Denies esophageal burning. Eats bland foods and avoids typical triggers. No other identified triggers. Throat burning is pretty much all the time. She has noticed it's hard to swallow some of the bigger pills, and occasionally has pill dysphagia, no solid food dysphagia. Has been taking Aleve for the past week, typically two a day for 3-4 days; typically less often. Denies ASA powders. Denies N/V, fever, chills, hematochezia, melena. Has had subjective 8 lb weight loss in the past 6 months. Denies URI or flu-like symptoms. Denies loss of sense of taste or smell. Denies chest pain, dyspnea, dizziness, lightheadedness, syncope, near syncope. Denies any other upper or lower GI symptoms.  Continues to decline EGD. Last colonoscopy 2011 states she was awake and hollering. Declining further endoscopic evaluation.  Saw ENT recently and states they didn't find anything wrong (Dr. Melene Plan). States they've put a light down her throat. She does have seasonal allergies, no having significant worsening allergy symptoms and drainage. However, has been coughing up a lot of phlegm.  Past Medical History:  Diagnosis Date  . Degenerative joint disease    Hands; Rx -parenteral steroids  . Dizziness 12/2011   Onset in 12/2011  . Dyspnea   . Sinus bradycardia   . Thyroid disease     Past Surgical History:  Procedure Laterality Date  . ABDOMINAL HYSTERECTOMY    . CATARACT EXTRACTION W/PHACO Right 10/04/2012   Procedure: CATARACT EXTRACTION PHACO AND INTRAOCULAR LENS  PLACEMENT (IOC);  Surgeon: Tonny Branch, MD;  Location: [facility];  Service: Ophthalmology;  Laterality: Right;  CDE:  15.78  . COLONOSCOPY W/ POLYPECTOMY  01/2010   internal hemorrhoids;9 mm rectal tubular adenoma. next tcs 2018. INADEQUATE CONSCIOUS  SEDATION PER PATIENT  . ESOPHAGOGASTRODUODENOSCOPY (EGD) WITH PROPOFOL N/A 04/21/2016   Dr. [name]: Normal esophagus s/p Maloney dilation, unable to exclude occult cervical esophageal web.   Marland Kitchen FINGER ARTHRODESIS Right 06/06/2014   Procedure: ARTHRODESIS RIGHT THUMB INTRAPHALANGEAL JOINT;  Surgeon: Leanora Cover, MD;  Location: [place];  Service: Orthopedics;  Laterality: Right;  . IRRIGATION AND DEBRIDEMENT ABSCESS Left 05/02/2014   Procedure: MINOR INCISION AND DRAINAGE OF LEFT RING FINGER ABSCESS;  Surgeon: Leanora Cover, MD;  Location: [place];  Service: Orthopedics;  Laterality: Left;  I & D LEFT RING FINGER  . 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 MALONEY DILATION N/A 04/21/2016   Procedure: Venia Minks DILATION;  Surgeon: Daneil Dolin, MD;  Location: [facility];  Service: Endoscopy;  Laterality: N/A;  . SKIN CANCER EXCISION     Nose    Current Outpatient Medications  Medication Sig Dispense Refill  . Biotin 10000 MCG TABS Take 10,000 mcg by mouth daily.    . Calcium Carb-Cholecalciferol (CALCIUM 500+D PO) Take 1 tablet by mouth daily.    Marland Kitchen estradiol (ESTRACE) 0.5 MG tablet Take 0.5 mg by mouth daily.    Marland Kitchen HYDROcodone-acetaminophen (NORCO/VICODIN) 5-325 MG tablet Take 1 tablet by mouth daily as needed for moderate pain. 30 tablet 0  . meloxicam (MOBIC) 15 MG tablet TK 1 T PO D  11  . methimazole (TAPAZOLE) 10 MG tablet Take 10 mg by mouth 2 (two) times daily.     . pantoprazole (PROTONIX) 40 MG tablet TAKE 1 TABLET(40 MG) BY MOUTH TWICE DAILY 60 tablet 5  . folic acid (FOLVITE) Q000111Q MCG tablet Take 400 mcg by mouth daily.    Marland Kitchen omeprazole (PRILOSEC) 20 MG capsule Take 20 mg by mouth 2 (two) times daily before a meal.     No current facility-administered medications for this visit.    Facility-Administered Medications Ordered in Other Visits  Medication Dose Route Frequency Provider Last Rate Last Dose  . lidocaine hcl (ophth) (AKTEN) 3.5  % ophthalmic gel 1 application  1 application Right Eye Once Tonny Branch, MD        Allergies as of 04/21/2019 - Review Complete 11/24/2018  Allergen Reaction Noted  . Levaquin [levofloxacin] Nausea And Vomiting 03/25/2017  . Sulfa antibiotics Rash 09/21/2011    Family History  Problem Relation Age of Onset  . Non-Hodgkin's lymphoma Sister   . Lung cancer Brother   . Colon cancer Neg Hx     Social History   Socioeconomic History  . Marital status: Divorced    Spouse name: Not on file  . Number of children: Not on file  . Years of education: Not on file  . Highest education level: Not on file  Occupational History  . Not on file  Social Needs  . Financial resource strain: Not on file  . Food insecurity    Worry: Not on file    Inability: Not on file  . Transportation needs    Medical: Not on file    Non-medical: Not on file  Tobacco Use  . Smoking status: Never Smoker  . Smokeless tobacco: Never Used  Substance and Sexual Activity  . Alcohol use: No  .  Drug use: No  . Sexual activity: Not Currently    Birth control/protection: Surgical  Lifestyle  . Physical activity    Days per week: Not on file    Minutes per session: Not on file  . Stress: Not on file  Relationships  . Social Herbalist on phone: Not on file    Gets together: Not on file    Attends religious service: Not on file    Active member of club or organization: Not on file    Attends meetings of clubs or organizations: Not on file    Relationship status: Not on file  Other Topics Concern  . Not on file  Social History Narrative  . Not on file    Review of Systems: General: Negative for anorexia, weight loss, fever, chills, fatigue, weakness. Eyes: Negative for vision changes.  ENT: Negative for hoarseness, difficulty swallowing , nasal congestion. CV: Negative for chest pain, angina, palpitations, dyspnea on exertion, peripheral edema.  Respiratory: Negative for dyspnea at rest,  dyspnea on exertion, cough, sputum, wheezing.  GI: See history of present illness. GU:  Negative for dysuria, hematuria, urinary incontinence, urinary frequency, nocturnal urination.  MS: Negative for joint pain, low back pain.  Derm: Negative for rash or itching.  Neuro: Negative for weakness, abnormal sensation, seizure, frequent headaches, memory loss, confusion.  Psych: Negative for anxiety, depression, suicidal ideation, hallucinations.  Endo: Negative for unusual weight change.  Heme: Negative for bruising or bleeding. Allergy: Negative for rash or hives.  Physical Exam: Note: limited exam due to virtual visit General:   Alert and oriented. Pleasant and cooperative. Well-nourished and well-developed.  Head:  Normocephalic and atraumatic. Eyes:  Without icterus, sclera clear and conjunctiva pink.  Ears:  Normal auditory acuity. Skin:  Intact without facial significant lesions or rashes. Neurologic:  Alert and oriented x4;  grossly normal neurologically. Psych:  Alert and cooperative. Normal mood and affect. Heme/Lymph/Immune: No excessive bruising noted.

## 2019-04-21 NOTE — Assessment & Plan Note (Signed)
Epigastric urning and esophageal burning has improved and essentially resolved with PPI. Continues on Protonix bid. Still having frequent throat burning. Has seen ENT and they tell her they can't see any problem. I will send in Carafate bid prn to see if this helps. Continue PPI. Follow-up in 2 months (she will call to schedule) with Dr. [name]

## 2019-04-22 ENCOUNTER — Encounter: Payer: Self-pay | Admitting: Internal Medicine

## 2019-04-25 ENCOUNTER — Telehealth: Payer: Self-pay | Admitting: Internal Medicine

## 2019-04-25 NOTE — Telephone Encounter (Signed)
Pt returned call. The medication that pt feels is harder to swallow is Lactulose. Pt was advised to dissolve her pill in water prior to taking it. The pharmacy has advised dissolving Lactulose pills in water. Pt will try this and call back if she isn't able to dissolve medication.

## 2019-04-25 NOTE — Telephone Encounter (Signed)
Called pt. The phone call dropped. Left a message on pt's cell number. Home number is currently busy. Will call pt back.

## 2019-04-25 NOTE — Telephone Encounter (Signed)
[phone]  Patient needs to be called, the pills we prescribed to her are too big to swallow

## 2019-04-28 ENCOUNTER — Telehealth: Payer: Self-pay | Admitting: *Deleted

## 2019-04-28 NOTE — Telephone Encounter (Signed)
Pt says Sucralfate is making her very sick.  She says that she can not take it.  Pt says it is making her vomit and sick on the stomach. Pt wants to know if we can recommend something else.  [phone]

## 2019-04-28 NOTE — Telephone Encounter (Signed)
See other phone note for documentation.  

## 2019-04-28 NOTE — Telephone Encounter (Signed)
Pt called to let EG know that she can't take the Lactulose medication. Pt has tried to let the medication dissolve in water but becomes nauseated and vomits when taking Lactulose. Pt has tired cutting the pill in half instead of dissolving the medication and she feels the same way. Pt originally thought the medication was too large to swallow but the medication makes her vomit and nauseated.

## 2019-04-29 NOTE — Telephone Encounter (Signed)
I'm a little confused. Lactulose is a liquid. Does she mean Xifaxan?

## 2019-05-02 NOTE — Telephone Encounter (Signed)
Noted. Pt stated Lactulose on the phone. Correction,  Pt is letting the Carafate tablet dissolve.

## 2019-05-03 ENCOUNTER — Other Ambulatory Visit: Payer: Self-pay

## 2019-05-03 DIAGNOSIS — Z20822 Contact with and (suspected) exposure to covid-19: Secondary | ICD-10-CM

## 2019-05-04 DIAGNOSIS — Z681 Body mass index (BMI) 19 or less, adult: Secondary | ICD-10-CM | POA: Diagnosis not present

## 2019-05-04 DIAGNOSIS — H6121 Impacted cerumen, right ear: Secondary | ICD-10-CM | POA: Diagnosis not present

## 2019-05-04 DIAGNOSIS — J019 Acute sinusitis, unspecified: Secondary | ICD-10-CM | POA: Diagnosis not present

## 2019-05-04 LAB — NOVEL CORONAVIRUS, NAA: SARS-CoV-2, NAA: NOT DETECTED

## 2019-05-05 NOTE — Telephone Encounter (Signed)
If she wants, I can try and send in the liquid carafate and see if this is any better, at which point we can see what it costs.  Let me know. If not we will try a different option.

## 2019-05-06 NOTE — Telephone Encounter (Signed)
Spoke with pt. Pt states the Carafate susp isn't covered under her insurance. Pt has started back taking Pantoprazole. Pt says everything our office has given her makes her gag. Pt currently has a burning sensation and she's had it for several months and it isn't getting better. Pt states nothing seems to help her. Pt also gets irritation from her throat when vomiting.

## 2019-05-10 ENCOUNTER — Telehealth: Payer: Self-pay | Admitting: *Deleted

## 2019-05-10 NOTE — Telephone Encounter (Signed)
Reviewed negative [phone] results with the patient. No further questions.

## 2019-05-16 DIAGNOSIS — N342 Other urethritis: Secondary | ICD-10-CM | POA: Diagnosis not present

## 2019-05-16 DIAGNOSIS — Z681 Body mass index (BMI) 19 or less, adult: Secondary | ICD-10-CM | POA: Diagnosis not present

## 2019-05-16 DIAGNOSIS — Z1389 Encounter for screening for other disorder: Secondary | ICD-10-CM | POA: Diagnosis not present

## 2019-06-03 DIAGNOSIS — F419 Anxiety disorder, unspecified: Secondary | ICD-10-CM | POA: Diagnosis not present

## 2019-06-03 DIAGNOSIS — L309 Dermatitis, unspecified: Secondary | ICD-10-CM | POA: Diagnosis not present

## 2019-06-03 DIAGNOSIS — Z681 Body mass index (BMI) 19 or less, adult: Secondary | ICD-10-CM | POA: Diagnosis not present

## 2019-06-03 DIAGNOSIS — L821 Other seborrheic keratosis: Secondary | ICD-10-CM | POA: Diagnosis not present

## 2019-06-03 DIAGNOSIS — L2089 Other atopic dermatitis: Secondary | ICD-10-CM | POA: Diagnosis not present

## 2019-06-03 DIAGNOSIS — M1991 Primary osteoarthritis, unspecified site: Secondary | ICD-10-CM | POA: Diagnosis not present

## 2019-06-14 DIAGNOSIS — L309 Dermatitis, unspecified: Secondary | ICD-10-CM | POA: Diagnosis not present

## 2019-06-14 DIAGNOSIS — Z681 Body mass index (BMI) 19 or less, adult: Secondary | ICD-10-CM | POA: Diagnosis not present

## 2019-06-14 DIAGNOSIS — N342 Other urethritis: Secondary | ICD-10-CM | POA: Diagnosis not present

## 2019-06-28 ENCOUNTER — Ambulatory Visit: Payer: PPO | Admitting: Internal Medicine

## 2019-06-28 ENCOUNTER — Other Ambulatory Visit: Payer: Self-pay

## 2019-06-28 ENCOUNTER — Ambulatory Visit: Payer: PPO | Admitting: Adult Health

## 2019-06-28 ENCOUNTER — Encounter: Payer: Self-pay | Admitting: Advanced Practice Midwife

## 2019-06-28 VITALS — BP 159/80 | HR 59 | Ht 63.0 in | Wt 98.0 lb

## 2019-06-28 DIAGNOSIS — R300 Dysuria: Secondary | ICD-10-CM | POA: Diagnosis not present

## 2019-06-28 DIAGNOSIS — N952 Postmenopausal atrophic vaginitis: Secondary | ICD-10-CM

## 2019-06-28 DIAGNOSIS — R350 Frequency of micturition: Secondary | ICD-10-CM | POA: Diagnosis not present

## 2019-06-28 DIAGNOSIS — R319 Hematuria, unspecified: Secondary | ICD-10-CM

## 2019-06-28 LAB — POCT URINALYSIS DIPSTICK
Glucose, UA: NEGATIVE
Ketones, UA: NEGATIVE
Leukocytes, UA: NEGATIVE
Nitrite, UA: NEGATIVE
Protein, UA: NEGATIVE

## 2019-06-28 MED ORDER — CEPHALEXIN 500 MG PO CAPS: 500.0000 mg | ORAL_CAPSULE | Freq: Two times a day (BID) | ORAL | 0 refills | Status: DC

## 2019-06-28 NOTE — Progress Notes (Signed)
  Subjective:     Patient ID: Robin Preston, female   DOB: December 08, 1945, 74 y.o.   MRN: [id number]  HPI Robin Preston is a 74 year old white female, divorced, sp hysterectomy in her 20's in complaining of vaginal pressure and urinary frequency and burning with urination, started about 12/23 was prescribed Cipro, by Rowan Blase PA,  but did not get til 12/28 or so and it made her have nausea and vomiting, so only took one. PCP is Dr Hilma Favors.  Review of Systems Has vaginal pressure Urinary frequency  Burning with urination Not sexually active. Denies any fever or back pain She says she has not felt good this past year Hands cold.she says used to take B12   Reviewed past medical,surgical, social and family history. Reviewed medications and allergies.     Objective:   Physical Exam BP (!) 159/80 (BP Location: Right Arm, Patient Position: Sitting, Cuff Size: Normal)   Pulse (!) 59   Ht 5\' 3"  (1.6 m)   Wt 98 lb (44.5 kg)   BMI 17.36 kg/m urine dipstick trace blood. Skin warm and dry.Pelvic: external genitalia is normal in appearance no lesions, vagina: atrophic,urethra has no lesions or masses noted, cervix and uterus are absent, adnexa: no masses or tenderness noted. Bladder is mildly tender and no masses felt.  Fall risk is low Examination chaperoned by Levy Pupa LPN     Assessment:     1. Hematuria, unspecified type UA C&S sent  2. Burning with urination Will rx keflex Meds ordered this encounter  Medications  . cephALEXin (KEFLEX) 500 MG capsule    Sig: Take 1 capsule (500 mg total) by mouth 2 (two) times daily.    Dispense:  14 capsule    Refill:  0    Order Specific Question:   Supervising Provider    Answer:   Elonda Husky, LUTHER H [[id number]]    3. Urinary frequency   4. Vaginal atrophy    Plan:     UA C&S sent Will rx keflex If continues with pressure may add premarin vaginal cream  Follow up prn Follow up with PCP for possible labs

## 2019-06-29 LAB — URINALYSIS, ROUTINE W REFLEX MICROSCOPIC
Bilirubin, UA: NEGATIVE
Glucose, UA: NEGATIVE
Ketones, UA: NEGATIVE
Nitrite, UA: NEGATIVE
Protein,UA: NEGATIVE
RBC, UA: NEGATIVE
Specific Gravity, UA: 1.013 (ref 1.005–1.030)
Urobilinogen, Ur: 0.2 mg/dL (ref 0.2–1.0)
pH, UA: 6 (ref 5.0–7.5)

## 2019-06-29 LAB — MICROSCOPIC EXAMINATION
Bacteria, UA: NONE SEEN
Casts: NONE SEEN /lpf

## 2019-06-30 ENCOUNTER — Telehealth: Payer: Self-pay | Admitting: *Deleted

## 2019-06-30 LAB — URINE CULTURE

## 2019-06-30 NOTE — Telephone Encounter (Signed)
-----   Message from Estill Dooms, NP sent at 06/30/2019  9:47 AM EST ----- Let Jerald know urine culture did not grow anything.

## 2019-06-30 NOTE — Telephone Encounter (Signed)
Left message letting pt know urine culture didn't grow anything. [place]

## 2019-07-21 ENCOUNTER — Other Ambulatory Visit: Payer: Self-pay | Admitting: Nurse Practitioner

## 2019-07-26 ENCOUNTER — Ambulatory Visit: Payer: PPO | Admitting: Internal Medicine

## 2019-08-29 ENCOUNTER — Other Ambulatory Visit (HOSPITAL_COMMUNITY): Payer: Self-pay | Admitting: Physician Assistant

## 2019-08-29 DIAGNOSIS — M1991 Primary osteoarthritis, unspecified site: Secondary | ICD-10-CM | POA: Diagnosis not present

## 2019-08-29 DIAGNOSIS — R0602 Shortness of breath: Secondary | ICD-10-CM

## 2019-08-29 DIAGNOSIS — R8290 Unspecified abnormal findings in urine: Secondary | ICD-10-CM | POA: Diagnosis not present

## 2019-08-29 DIAGNOSIS — Z1389 Encounter for screening for other disorder: Secondary | ICD-10-CM | POA: Diagnosis not present

## 2019-08-29 DIAGNOSIS — Z0000 Encounter for general adult medical examination without abnormal findings: Secondary | ICD-10-CM | POA: Diagnosis not present

## 2019-08-29 DIAGNOSIS — Z681 Body mass index (BMI) 19 or less, adult: Secondary | ICD-10-CM | POA: Diagnosis not present

## 2019-08-29 DIAGNOSIS — Z0001 Encounter for general adult medical examination with abnormal findings: Secondary | ICD-10-CM | POA: Diagnosis not present

## 2019-08-29 DIAGNOSIS — G894 Chronic pain syndrome: Secondary | ICD-10-CM | POA: Diagnosis not present

## 2019-08-29 DIAGNOSIS — E0590 Thyrotoxicosis, unspecified without thyrotoxic crisis or storm: Secondary | ICD-10-CM | POA: Diagnosis not present

## 2019-08-30 ENCOUNTER — Other Ambulatory Visit: Payer: Self-pay

## 2019-08-30 ENCOUNTER — Ambulatory Visit (HOSPITAL_COMMUNITY)
Admission: RE | Admit: 2019-08-30 | Discharge: 2019-08-30 | Disposition: A | Discharge status: Home / Self Care | Payer: PPO | Source: Ambulatory Visit | Attending: Physician Assistant | Admitting: Physician Assistant

## 2019-08-30 DIAGNOSIS — R0602 Shortness of breath: Secondary | ICD-10-CM | POA: Diagnosis not present

## 2019-09-06 ENCOUNTER — Other Ambulatory Visit: Payer: Self-pay | Admitting: Nurse Practitioner

## 2019-09-08 ENCOUNTER — Telehealth: Payer: Self-pay | Admitting: Internal Medicine

## 2019-09-08 NOTE — Telephone Encounter (Signed)
RX was sent in today by LSL.

## 2019-09-08 NOTE — Telephone Encounter (Signed)
Pt needs her pantoprazole prescription called into [facility] on [address].

## 2019-09-21 DIAGNOSIS — E039 Hypothyroidism, unspecified: Secondary | ICD-10-CM | POA: Diagnosis not present

## 2019-09-21 DIAGNOSIS — M1991 Primary osteoarthritis, unspecified site: Secondary | ICD-10-CM | POA: Diagnosis not present

## 2019-09-21 DIAGNOSIS — L4051 Distal interphalangeal psoriatic arthropathy: Secondary | ICD-10-CM | POA: Diagnosis not present

## 2019-09-21 DIAGNOSIS — G894 Chronic pain syndrome: Secondary | ICD-10-CM | POA: Diagnosis not present

## 2019-10-16 IMAGING — DX DG CHEST 2V
2 series · 2 of 2 positions shown · non-contrast
Comparison: 03/27/2014

CLINICAL DATA: Shortness of breath, dizziness

EXAM:
CHEST - 2 VIEW

[chest pa]
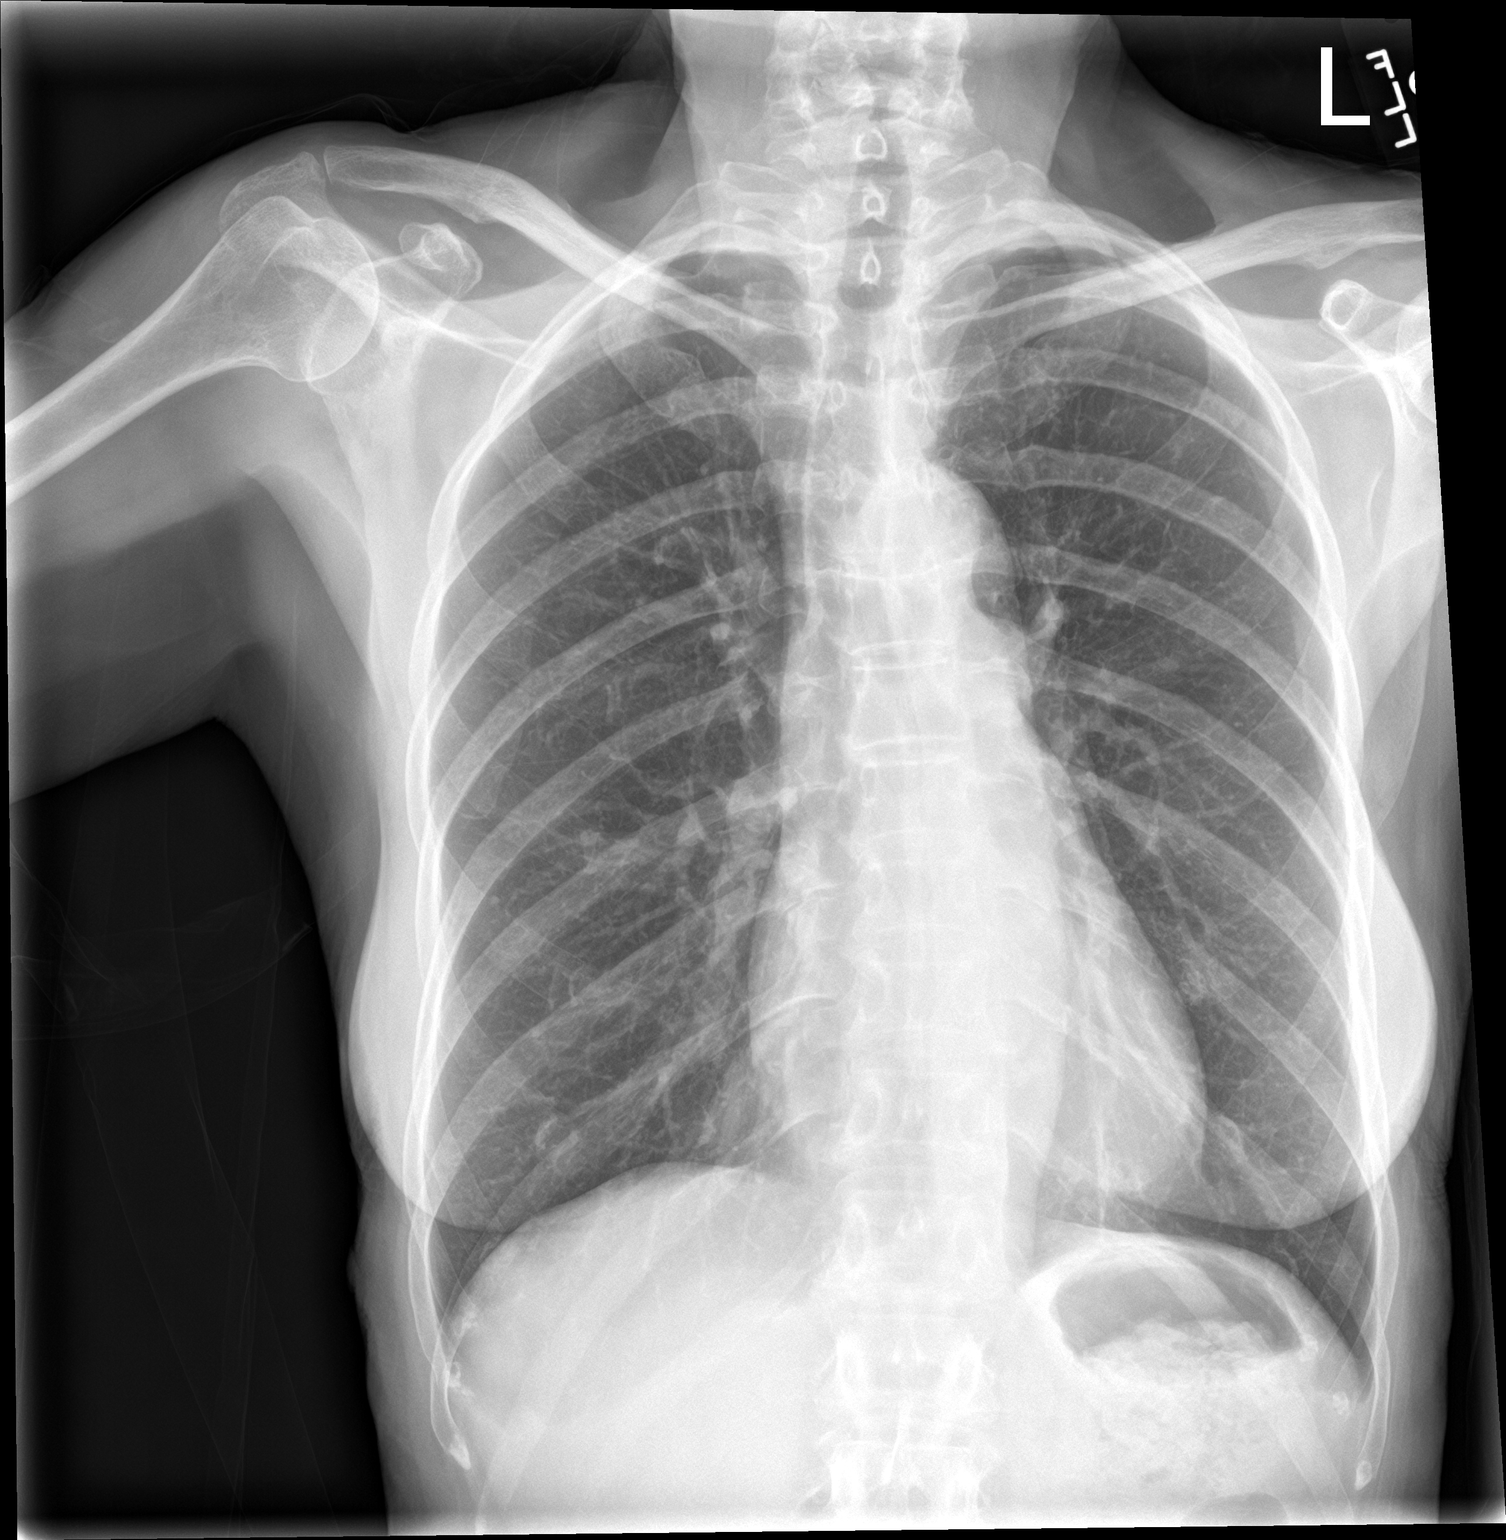

[chest lat]
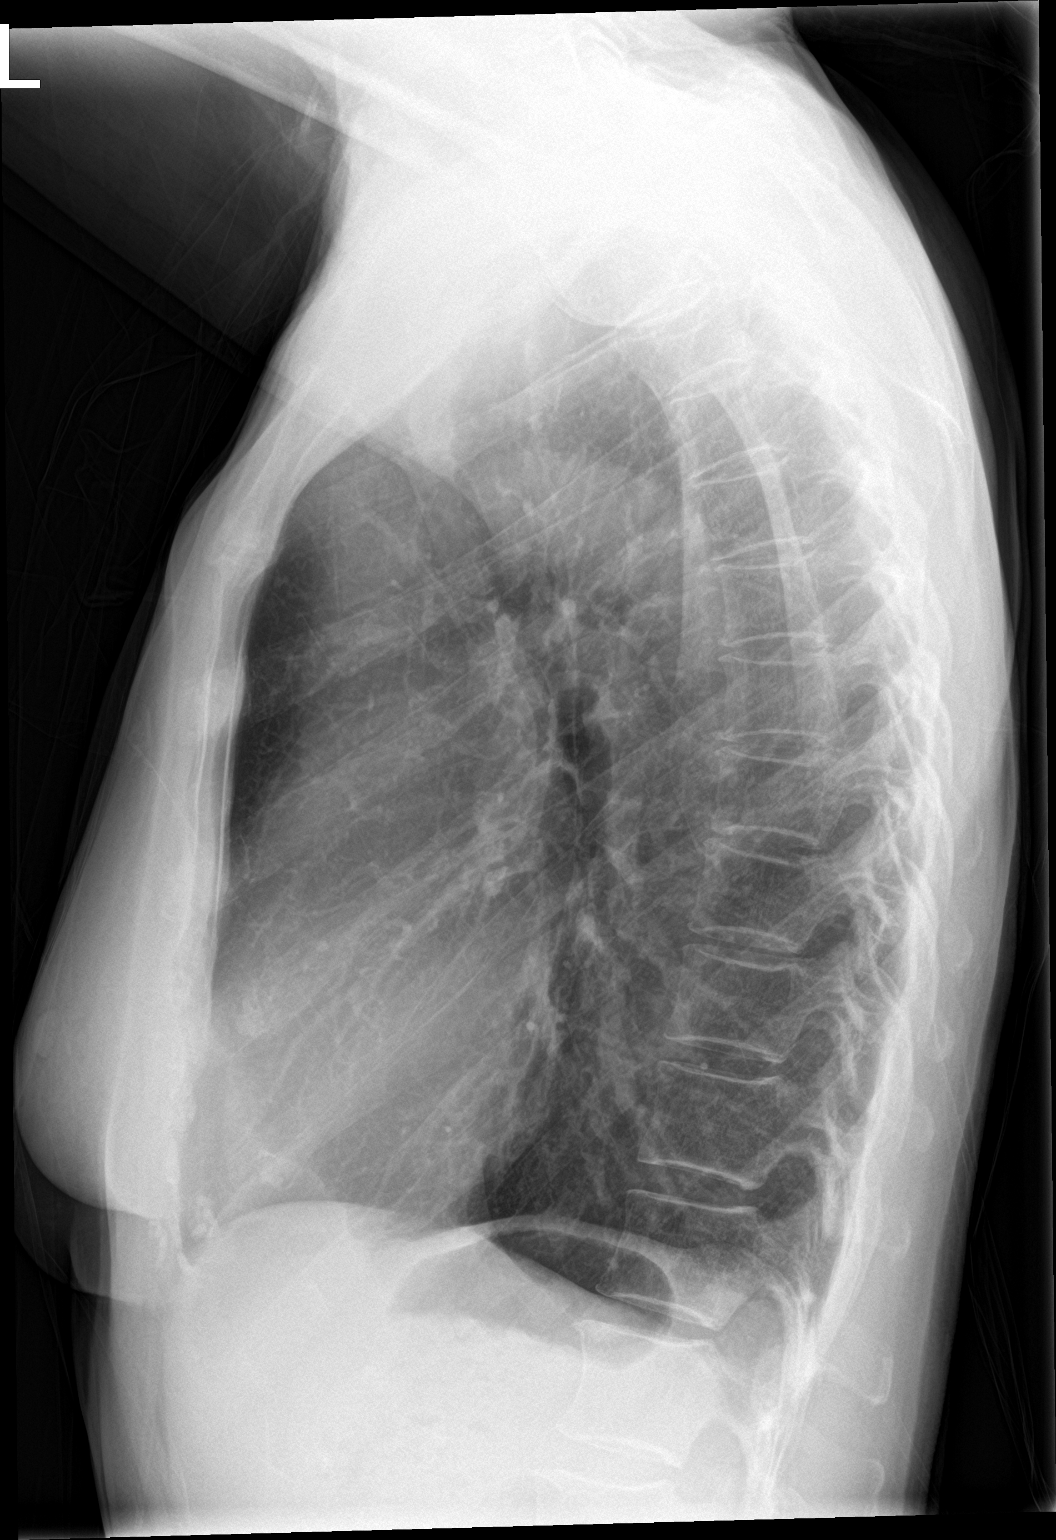

[2 of 2 positions shown; findings below may reference images not displayed]

FINDINGS: Heart and mediastinal contours are within normal limits. No focal
opacities or effusions. No acute bony abnormality.
IMPRESSION: No active cardiopulmonary disease.

## 2019-10-17 ENCOUNTER — Ambulatory Visit: Payer: PPO | Admitting: Cardiology

## 2019-10-17 ENCOUNTER — Encounter: Payer: Self-pay | Admitting: Cardiology

## 2019-10-17 VITALS — BP 166/82 | HR 50 | Temp 97.4°F | Ht 63.0 in | Wt 98.0 lb

## 2019-10-17 DIAGNOSIS — R0602 Shortness of breath: Secondary | ICD-10-CM

## 2019-10-17 DIAGNOSIS — I4589 Other specified conduction disorders: Secondary | ICD-10-CM | POA: Diagnosis not present

## 2019-10-17 DIAGNOSIS — R0600 Dyspnea, unspecified: Secondary | ICD-10-CM | POA: Diagnosis not present

## 2019-10-17 DIAGNOSIS — R0609 Other forms of dyspnea: Secondary | ICD-10-CM

## 2019-10-17 NOTE — Progress Notes (Signed)
Cardiology Office Note  Date: 10/17/2019   ID: Robin Preston, DOB 1945/07/18, MRN [id number]  PCP:  [name], MD  Cardiologist:  Rozann Lesches, MD Electrophysiologist:  None   Chief Complaint  Patient presents with  . Shortness of Breath    History of Present Illness: Robin Preston is a 74 y.o. female referred for cardiology consultation by Dr. Hilma Favors for the evaluation of shortness of breath.  She describes a feeling of breathlessness, both with activity, sometimes when speaking in long sentences.  This has been going on for several months to over a year.  She does not report any chest tightness, has had no sudden dizziness or syncope.  Her main concern is whether she might need a pacemaker since she has been told that she has a slow heart rate.  Bradycardia goes back for many, many years however, and it is not entirely clear that she has been symptomatic in relation to it.  She had a chest x-ray in March arranged by PCP which showed no acute findings.  Records indicate evaluation by Dr. Lattie Haw back in 2013.  She underwent an exercise echocardiogram at that time, overall nondiagnostic study.  I personally reviewed her ECG today which shows sinus bradycardia.  Intervals are otherwise normal.  She is not on any AV nodal blockers.  Past Medical History:  Diagnosis Date  . Acid reflux   . Osteoarthritis     Past Surgical History:  Procedure Laterality Date  . ABDOMINAL HYSTERECTOMY    . CATARACT EXTRACTION W/PHACO Right 10/04/2012   Procedure: CATARACT EXTRACTION PHACO AND INTRAOCULAR LENS PLACEMENT (IOC);  Surgeon: Tonny Branch, MD;  Location: [facility];  Service: Ophthalmology;  Laterality: Right;  CDE:  15.78  . COLONOSCOPY W/ POLYPECTOMY  01/2010   internal hemorrhoids;9 mm rectal tubular adenoma. next tcs 2018. INADEQUATE CONSCIOUS SEDATION PER PATIENT  . ESOPHAGOGASTRODUODENOSCOPY (EGD) WITH PROPOFOL N/A 04/21/2016   Dr. [name]: Normal esophagus s/p Maloney dilation, unable  to exclude occult cervical esophageal web.   Marland Kitchen FINGER ARTHRODESIS Right 06/06/2014   Procedure: ARTHRODESIS RIGHT THUMB INTRAPHALANGEAL JOINT;  Surgeon: Leanora Cover, MD;  Location: [facility];  Service: Orthopedics;  Laterality: Right;  . IRRIGATION AND DEBRIDEMENT ABSCESS Left 05/02/2014   Procedure: MINOR INCISION AND DRAINAGE OF LEFT RING FINGER ABSCESS;  Surgeon: Leanora Cover, MD;  Location: [facility];  Service: Orthopedics;  Laterality: Left;  I & D LEFT RING FINGER  . 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 MALONEY DILATION N/A 04/21/2016   Procedure: Venia Minks DILATION;  Surgeon: Daneil Dolin, MD;  Location: [facility];  Service: Endoscopy;  Laterality: N/A;  . SKIN CANCER EXCISION     Nose    Current Outpatient Medications  Medication Sig Dispense Refill  . Biotin 10000 MCG TABS Take 10,000 mcg by mouth daily.    . Calcium Carb-Cholecalciferol (CALCIUM 500+D PO) Take 1 tablet by mouth daily.    . diazepam (VALIUM) 2 MG tablet Take 2 mg by mouth daily.    Marland Kitchen estradiol (ESTRACE) 0.5 MG tablet Take 0.5 mg by mouth daily.    . fludrocortisone (FLORINEF) 0.1mg /mL SUSP Take 0.1 mg by mouth daily.    . folic acid (FOLVITE) Q000111Q MCG tablet Take 400 mcg by mouth daily.    . meloxicam (MOBIC) 7.5 MG tablet Take 7.5 mg by mouth daily.   11  . methimazole (TAPAZOLE) 10 MG tablet Take 10 mg by mouth  daily.     . oxyCODONE-acetaminophen (PERCOCET/ROXICET) 5-325 MG tablet Take 1 tablet by mouth every 8 (eight) hours as needed.    . pantoprazole (PROTONIX) 40 MG tablet Take 40 mg by mouth daily.    . Turmeric (QC TUMERIC COMPLEX PO) Take 1 tablet by mouth daily.    . Vit B6-Vit B12-Omega 3 Acids (VITAMIN B PLUS+ PO) Take 1,200 mg by mouth daily.     Marland Kitchen VITAMIN D PO Take by mouth daily.     No current facility-administered medications for this visit.   Facility-Administered Medications Ordered in Other Visits  Medication Dose Route Frequency Provider  Last Rate Last Admin  . lidocaine hcl (ophth) (AKTEN) 3.5 % ophthalmic gel 1 application  1 application Right Eye Once Tonny Branch, MD       Allergies:  Levaquin [levofloxacin], Ciprofloxacin, and Sulfa antibiotics   Social History: The patient  reports that she has never smoked. She has never used smokeless tobacco. She reports that she does not drink alcohol or use drugs.   Family History: The patient's family history includes Lung cancer in her brother; Non-Hodgkin's lymphoma in her sister.   ROS:  No frank syncope.  Physical Exam: VS:  BP (!) 166/82   Pulse (!) 50   Temp (!) 97.4 F (36.3 C) (Temporal)   Ht 5\' 3"  (1.6 m)   Wt 98 lb (44.5 kg)   SpO2 99%   BMI 17.36 kg/m , BMI Body mass index is 17.36 kg/m.  Wt Readings from Last 3 Encounters:  10/17/19 98 lb (44.5 kg)  06/28/19 98 lb (44.5 kg)  11/24/18 98 lb 3.2 oz (44.5 kg)    General: Thin elderly woman, appears comfortable at rest. HEENT: Conjunctiva and lids normal, wearing a mask. Neck: Supple, no elevated JVP or carotid bruits, no thyromegaly. Lungs: Clear to auscultation, nonlabored breathing at rest. Cardiac: Regular rate and rhythm, no S3, soft systolic murmur, no pericardial rub. Abdomen: Soft, bowel sounds present. Extremities: No pitting edema, distal pulses 2+. Skin: Warm and dry. Musculoskeletal: Mild kyphosis noted. Neuropsychiatric: Alert and oriented x3, affect grossly appropriate.  ECG:  An ECG dated 04/17/2016 was personally reviewed today and demonstrated:  Sinus bradycardia.  Recent Labwork:  May 2019: Cholesterol 207, HDL 74, LDL 114, triglycerides 95  Other Studies Reviewed Today:  Exercise echocardiogram 03/30/2012: Study Conclusions   - Stress: There was a normal resting blood pressure with a  hypertensive response to stress. The patient experienced  no chest pain during stress.  - Stress ECG conclusions: The stress ECG was negative for  ischemia.  - Staged echo: This study is  inadequate for the diagnostic  evaluation of left ventricular regional Quevedo motion due to  rapid reduction in heart rate in early recovery and  therefore "peak" image aquisition at far below 85% MPHR.  - Peak stress: LV global systolic function was vigorous. The  estimated LV ejection fraction was 65% to 70% without Gallicchio  motion abnormalities noted at less than 85% MPHR.   Assessment and Plan:  1.  Shortness of breath, fairly chronic in description.  Patient is concerned about possibly needing a pacemaker due to bradycardia at rest, although her history of slow heart rate goes back for many years, and it is not entirely clear that she has been symptomatic before.  Last cardiac testing was in 2013.  I reviewed her ECG today which shows sinus bradycardia at 47 but normal intervals.  We will obtain a GXT mainly to exclude  chronotropic incompetence, also an echocardiogram to assess cardiac structure and function.  Recent chest x-ray showed no acute findings.  2.  Elevated blood pressure reading today, no standing history of hypertension.  She is in fact on Florinef.  Would recommend follow-up with PCP.  Medication Adjustments/Labs and Tests Ordered: Current medicines are reviewed at length with the patient today.  Concerns regarding medicines are outlined above.   Tests Ordered: Orders Placed This Encounter  Procedures  . Exercise Tolerance Test  . EKG 12-Lead  . ECHOCARDIOGRAM COMPLETE    Medication Changes: No orders of the defined types were placed in this encounter.   Disposition:  Follow up test results.  Signed, Satira Sark, MD, [facility] 10/17/2019 2:01 PM    [facility] [address], [place], [place] [address] Phone: [phone]; Fax: [phone]

## 2019-10-17 NOTE — Patient Instructions (Addendum)
Medication Instructions: Your physician recommends that you continue on your current medications as directed. Please refer to the Current Medication list given to you today.   Labwork: None today  Procedures/Testing: Your physician has requested that you have an exercise tolerance test. For further information please visit [email or web address]. Please also follow instruction sheet, as given.  Get COVID test before stress test  Your physician has requested that you have an echocardiogram. Echocardiography is a painless test that uses sound waves to create images of your heart. It provides your doctor with information about the size and shape of your heart and how well your heart's chambers and valves are working. This procedure takes approximately one hour. There are no restrictions for this procedure.     Follow-Up: We will call you with results  Any Additional Special Instructions Will Be Listed Below (If Applicable).     If you need a refill on your cardiac medications before your next appointment, please call your pharmacy.

## 2019-10-21 DIAGNOSIS — G629 Polyneuropathy, unspecified: Secondary | ICD-10-CM | POA: Diagnosis not present

## 2019-10-21 DIAGNOSIS — E0590 Thyrotoxicosis, unspecified without thyrotoxic crisis or storm: Secondary | ICD-10-CM | POA: Diagnosis not present

## 2019-10-21 DIAGNOSIS — M810 Age-related osteoporosis without current pathological fracture: Secondary | ICD-10-CM | POA: Diagnosis not present

## 2019-10-21 DIAGNOSIS — M1991 Primary osteoarthritis, unspecified site: Secondary | ICD-10-CM | POA: Diagnosis not present

## 2019-11-04 ENCOUNTER — Other Ambulatory Visit (HOSPITAL_COMMUNITY)
Admission: RE | Admit: 2019-11-04 | Discharge: 2019-11-04 | Disposition: A | Discharge status: Home / Self Care | Payer: PPO | Source: Ambulatory Visit | Attending: Cardiology | Admitting: Cardiology

## 2019-11-04 ENCOUNTER — Other Ambulatory Visit: Payer: Self-pay

## 2019-11-04 DIAGNOSIS — Z20822 Contact with and (suspected) exposure to covid-19: Secondary | ICD-10-CM | POA: Diagnosis not present

## 2019-11-04 DIAGNOSIS — Z01812 Encounter for preprocedural laboratory examination: Secondary | ICD-10-CM | POA: Insufficient documentation

## 2019-11-05 LAB — SARS CORONAVIRUS 2 (TAT 6-24 HRS): SARS Coronavirus 2: NEGATIVE

## 2019-11-07 ENCOUNTER — Other Ambulatory Visit: Payer: Self-pay

## 2019-11-07 ENCOUNTER — Ambulatory Visit (HOSPITAL_BASED_OUTPATIENT_CLINIC_OR_DEPARTMENT_OTHER)
Admission: RE | Admit: 2019-11-07 | Discharge: 2019-11-07 | Disposition: A | Discharge status: Home / Self Care | Payer: PPO | Source: Ambulatory Visit | Attending: Cardiology | Admitting: Cardiology

## 2019-11-07 ENCOUNTER — Ambulatory Visit (HOSPITAL_COMMUNITY)
Admission: RE | Admit: 2019-11-07 | Discharge: 2019-11-07 | Disposition: A | Discharge status: Home / Self Care | Payer: PPO | Source: Ambulatory Visit | Attending: Cardiology | Admitting: Cardiology

## 2019-11-07 DIAGNOSIS — I4589 Other specified conduction disorders: Secondary | ICD-10-CM | POA: Insufficient documentation

## 2019-11-07 DIAGNOSIS — R0609 Other forms of dyspnea: Secondary | ICD-10-CM

## 2019-11-07 DIAGNOSIS — R0600 Dyspnea, unspecified: Secondary | ICD-10-CM

## 2019-11-07 LAB — EXERCISE TOLERANCE TEST
Estimated workload: 2 METS
Exercise duration (min): 0 min
Exercise duration (sec): 33 s
MPHR: 145 {beats}/min
Peak HR: 101 {beats}/min
Percent HR: 69 %
RPE: 13
Rest HR: 50 {beats}/min

## 2019-11-07 NOTE — Progress Notes (Signed)
*  PRELIMINARY RESULTS* Echocardiogram 2D Echocardiogram has been performed.  Leavy Cella 11/07/2019, 12:49 PM

## 2019-11-08 DIAGNOSIS — G894 Chronic pain syndrome: Secondary | ICD-10-CM | POA: Diagnosis not present

## 2019-11-08 DIAGNOSIS — Z681 Body mass index (BMI) 19 or less, adult: Secondary | ICD-10-CM | POA: Diagnosis not present

## 2019-11-08 DIAGNOSIS — R0602 Shortness of breath: Secondary | ICD-10-CM | POA: Diagnosis not present

## 2019-11-08 DIAGNOSIS — Z1389 Encounter for screening for other disorder: Secondary | ICD-10-CM | POA: Diagnosis not present

## 2019-11-14 DIAGNOSIS — Z681 Body mass index (BMI) 19 or less, adult: Secondary | ICD-10-CM | POA: Diagnosis not present

## 2019-11-14 DIAGNOSIS — N342 Other urethritis: Secondary | ICD-10-CM | POA: Diagnosis not present

## 2019-11-21 DIAGNOSIS — E0590 Thyrotoxicosis, unspecified without thyrotoxic crisis or storm: Secondary | ICD-10-CM | POA: Diagnosis not present

## 2019-11-21 DIAGNOSIS — M810 Age-related osteoporosis without current pathological fracture: Secondary | ICD-10-CM | POA: Diagnosis not present

## 2019-11-21 DIAGNOSIS — G629 Polyneuropathy, unspecified: Secondary | ICD-10-CM | POA: Diagnosis not present

## 2019-11-21 DIAGNOSIS — M1991 Primary osteoarthritis, unspecified site: Secondary | ICD-10-CM | POA: Diagnosis not present

## 2019-12-14 DIAGNOSIS — J029 Acute pharyngitis, unspecified: Secondary | ICD-10-CM | POA: Diagnosis not present

## 2019-12-14 DIAGNOSIS — Z681 Body mass index (BMI) 19 or less, adult: Secondary | ICD-10-CM | POA: Diagnosis not present

## 2019-12-14 DIAGNOSIS — N342 Other urethritis: Secondary | ICD-10-CM | POA: Diagnosis not present

## 2019-12-21 DIAGNOSIS — G629 Polyneuropathy, unspecified: Secondary | ICD-10-CM | POA: Diagnosis not present

## 2019-12-21 DIAGNOSIS — E0590 Thyrotoxicosis, unspecified without thyrotoxic crisis or storm: Secondary | ICD-10-CM | POA: Diagnosis not present

## 2019-12-21 DIAGNOSIS — M810 Age-related osteoporosis without current pathological fracture: Secondary | ICD-10-CM | POA: Diagnosis not present

## 2019-12-21 DIAGNOSIS — M1991 Primary osteoarthritis, unspecified site: Secondary | ICD-10-CM | POA: Diagnosis not present

## 2020-01-20 DIAGNOSIS — M1991 Primary osteoarthritis, unspecified site: Secondary | ICD-10-CM | POA: Diagnosis not present

## 2020-01-20 DIAGNOSIS — G629 Polyneuropathy, unspecified: Secondary | ICD-10-CM | POA: Diagnosis not present

## 2020-01-20 DIAGNOSIS — M810 Age-related osteoporosis without current pathological fracture: Secondary | ICD-10-CM | POA: Diagnosis not present

## 2020-01-20 DIAGNOSIS — E0590 Thyrotoxicosis, unspecified without thyrotoxic crisis or storm: Secondary | ICD-10-CM | POA: Diagnosis not present

## 2020-03-22 DIAGNOSIS — G894 Chronic pain syndrome: Secondary | ICD-10-CM | POA: Diagnosis not present

## 2020-03-22 DIAGNOSIS — E039 Hypothyroidism, unspecified: Secondary | ICD-10-CM | POA: Diagnosis not present

## 2020-03-22 DIAGNOSIS — L4051 Distal interphalangeal psoriatic arthropathy: Secondary | ICD-10-CM | POA: Diagnosis not present

## 2020-03-22 DIAGNOSIS — F4542 Pain disorder with related psychological factors: Secondary | ICD-10-CM | POA: Diagnosis not present

## 2020-04-18 DIAGNOSIS — M5030 Other cervical disc degeneration, unspecified cervical region: Secondary | ICD-10-CM | POA: Diagnosis not present

## 2020-04-18 DIAGNOSIS — G894 Chronic pain syndrome: Secondary | ICD-10-CM | POA: Diagnosis not present

## 2020-04-18 DIAGNOSIS — M1991 Primary osteoarthritis, unspecified site: Secondary | ICD-10-CM | POA: Diagnosis not present

## 2020-04-18 DIAGNOSIS — Z23 Encounter for immunization: Secondary | ICD-10-CM | POA: Diagnosis not present

## 2020-04-18 DIAGNOSIS — L4051 Distal interphalangeal psoriatic arthropathy: Secondary | ICD-10-CM | POA: Diagnosis not present

## 2020-04-18 DIAGNOSIS — Z681 Body mass index (BMI) 19 or less, adult: Secondary | ICD-10-CM | POA: Diagnosis not present

## 2020-05-08 DIAGNOSIS — Z681 Body mass index (BMI) 19 or less, adult: Secondary | ICD-10-CM | POA: Diagnosis not present

## 2020-05-08 DIAGNOSIS — N342 Other urethritis: Secondary | ICD-10-CM | POA: Diagnosis not present

## 2020-05-08 DIAGNOSIS — R319 Hematuria, unspecified: Secondary | ICD-10-CM | POA: Diagnosis not present

## 2020-05-22 DIAGNOSIS — F4542 Pain disorder with related psychological factors: Secondary | ICD-10-CM | POA: Diagnosis not present

## 2020-05-22 DIAGNOSIS — L4051 Distal interphalangeal psoriatic arthropathy: Secondary | ICD-10-CM | POA: Diagnosis not present

## 2020-05-22 DIAGNOSIS — E039 Hypothyroidism, unspecified: Secondary | ICD-10-CM | POA: Diagnosis not present

## 2020-05-22 DIAGNOSIS — G894 Chronic pain syndrome: Secondary | ICD-10-CM | POA: Diagnosis not present

## 2020-06-22 DIAGNOSIS — G894 Chronic pain syndrome: Secondary | ICD-10-CM | POA: Diagnosis not present

## 2020-06-22 DIAGNOSIS — L4051 Distal interphalangeal psoriatic arthropathy: Secondary | ICD-10-CM | POA: Diagnosis not present

## 2020-06-22 DIAGNOSIS — F4542 Pain disorder with related psychological factors: Secondary | ICD-10-CM | POA: Diagnosis not present

## 2020-06-22 DIAGNOSIS — E039 Hypothyroidism, unspecified: Secondary | ICD-10-CM | POA: Diagnosis not present

## 2020-07-06 DIAGNOSIS — N342 Other urethritis: Secondary | ICD-10-CM | POA: Diagnosis not present

## 2020-07-26 DIAGNOSIS — Z681 Body mass index (BMI) 19 or less, adult: Secondary | ICD-10-CM | POA: Diagnosis not present

## 2020-07-26 DIAGNOSIS — N390 Urinary tract infection, site not specified: Secondary | ICD-10-CM | POA: Diagnosis not present

## 2020-07-26 DIAGNOSIS — N10 Acute tubulo-interstitial nephritis: Secondary | ICD-10-CM | POA: Diagnosis not present

## 2020-07-26 DIAGNOSIS — E538 Deficiency of other specified B group vitamins: Secondary | ICD-10-CM | POA: Diagnosis not present

## 2020-07-26 DIAGNOSIS — Z1331 Encounter for screening for depression: Secondary | ICD-10-CM | POA: Diagnosis not present

## 2020-08-06 DIAGNOSIS — N390 Urinary tract infection, site not specified: Secondary | ICD-10-CM | POA: Diagnosis not present

## 2020-09-11 DIAGNOSIS — M1991 Primary osteoarthritis, unspecified site: Secondary | ICD-10-CM | POA: Diagnosis not present

## 2020-09-11 DIAGNOSIS — L4051 Distal interphalangeal psoriatic arthropathy: Secondary | ICD-10-CM | POA: Diagnosis not present

## 2020-09-11 DIAGNOSIS — Z0000 Encounter for general adult medical examination without abnormal findings: Secondary | ICD-10-CM | POA: Diagnosis not present

## 2020-09-11 DIAGNOSIS — G629 Polyneuropathy, unspecified: Secondary | ICD-10-CM | POA: Diagnosis not present

## 2020-09-11 DIAGNOSIS — Z1331 Encounter for screening for depression: Secondary | ICD-10-CM | POA: Diagnosis not present

## 2020-09-11 DIAGNOSIS — M5030 Other cervical disc degeneration, unspecified cervical region: Secondary | ICD-10-CM | POA: Diagnosis not present

## 2020-09-11 DIAGNOSIS — G894 Chronic pain syndrome: Secondary | ICD-10-CM | POA: Diagnosis not present

## 2020-09-11 DIAGNOSIS — R946 Abnormal results of thyroid function studies: Secondary | ICD-10-CM | POA: Diagnosis not present

## 2020-09-11 DIAGNOSIS — E0590 Thyrotoxicosis, unspecified without thyrotoxic crisis or storm: Secondary | ICD-10-CM | POA: Diagnosis not present

## 2020-09-11 DIAGNOSIS — Z681 Body mass index (BMI) 19 or less, adult: Secondary | ICD-10-CM | POA: Diagnosis not present

## 2020-09-11 DIAGNOSIS — Z1389 Encounter for screening for other disorder: Secondary | ICD-10-CM | POA: Diagnosis not present

## 2020-09-19 DIAGNOSIS — L4051 Distal interphalangeal psoriatic arthropathy: Secondary | ICD-10-CM | POA: Diagnosis not present

## 2020-09-19 DIAGNOSIS — G894 Chronic pain syndrome: Secondary | ICD-10-CM | POA: Diagnosis not present

## 2020-09-19 DIAGNOSIS — E039 Hypothyroidism, unspecified: Secondary | ICD-10-CM | POA: Diagnosis not present

## 2020-09-19 DIAGNOSIS — F4542 Pain disorder with related psychological factors: Secondary | ICD-10-CM | POA: Diagnosis not present

## 2020-10-02 NOTE — Telephone Encounter (Signed)
Opened in error

## 2020-10-11 ENCOUNTER — Telehealth: Payer: Self-pay

## 2020-10-11 NOTE — Telephone Encounter (Signed)
Needs 1 yr fu with SMof BS Received: Today Drema Dallas, Amy Denman George, RN Called the patient to schedule. She said she does not need to be seen. No changes but she will call us if she needs [place].

## 2020-10-20 DIAGNOSIS — F4542 Pain disorder with related psychological factors: Secondary | ICD-10-CM | POA: Diagnosis not present

## 2020-10-20 DIAGNOSIS — G894 Chronic pain syndrome: Secondary | ICD-10-CM | POA: Diagnosis not present

## 2020-10-20 DIAGNOSIS — L4051 Distal interphalangeal psoriatic arthropathy: Secondary | ICD-10-CM | POA: Diagnosis not present

## 2020-10-20 DIAGNOSIS — E039 Hypothyroidism, unspecified: Secondary | ICD-10-CM | POA: Diagnosis not present

## 2020-10-23 DIAGNOSIS — N342 Other urethritis: Secondary | ICD-10-CM | POA: Diagnosis not present

## 2020-10-23 DIAGNOSIS — K219 Gastro-esophageal reflux disease without esophagitis: Secondary | ICD-10-CM | POA: Diagnosis not present

## 2020-10-23 DIAGNOSIS — Z681 Body mass index (BMI) 19 or less, adult: Secondary | ICD-10-CM | POA: Diagnosis not present

## 2020-10-23 DIAGNOSIS — M1991 Primary osteoarthritis, unspecified site: Secondary | ICD-10-CM | POA: Diagnosis not present

## 2020-10-23 DIAGNOSIS — M5451 Vertebrogenic low back pain: Secondary | ICD-10-CM | POA: Diagnosis not present

## 2020-12-07 DIAGNOSIS — R634 Abnormal weight loss: Secondary | ICD-10-CM | POA: Diagnosis not present

## 2020-12-07 DIAGNOSIS — M1991 Primary osteoarthritis, unspecified site: Secondary | ICD-10-CM | POA: Diagnosis not present

## 2020-12-07 DIAGNOSIS — M810 Age-related osteoporosis without current pathological fracture: Secondary | ICD-10-CM | POA: Diagnosis not present

## 2020-12-07 DIAGNOSIS — K219 Gastro-esophageal reflux disease without esophagitis: Secondary | ICD-10-CM | POA: Diagnosis not present

## 2020-12-07 DIAGNOSIS — R1310 Dysphagia, unspecified: Secondary | ICD-10-CM | POA: Diagnosis not present

## 2020-12-07 DIAGNOSIS — Z79899 Other long term (current) drug therapy: Secondary | ICD-10-CM | POA: Diagnosis not present

## 2020-12-07 DIAGNOSIS — Z681 Body mass index (BMI) 19 or less, adult: Secondary | ICD-10-CM | POA: Diagnosis not present

## 2020-12-10 ENCOUNTER — Other Ambulatory Visit (HOSPITAL_COMMUNITY): Payer: Self-pay | Admitting: Internal Medicine

## 2020-12-10 ENCOUNTER — Other Ambulatory Visit: Payer: Self-pay | Admitting: Internal Medicine

## 2020-12-10 DIAGNOSIS — R1310 Dysphagia, unspecified: Secondary | ICD-10-CM

## 2020-12-10 DIAGNOSIS — Z1231 Encounter for screening mammogram for malignant neoplasm of breast: Secondary | ICD-10-CM

## 2020-12-17 DIAGNOSIS — R944 Abnormal results of kidney function studies: Secondary | ICD-10-CM | POA: Diagnosis not present

## 2020-12-19 DIAGNOSIS — E871 Hypo-osmolality and hyponatremia: Secondary | ICD-10-CM | POA: Diagnosis not present

## 2021-01-16 DIAGNOSIS — G894 Chronic pain syndrome: Secondary | ICD-10-CM | POA: Diagnosis not present

## 2021-01-16 DIAGNOSIS — F419 Anxiety disorder, unspecified: Secondary | ICD-10-CM | POA: Diagnosis not present

## 2021-01-16 DIAGNOSIS — L989 Disorder of the skin and subcutaneous tissue, unspecified: Secondary | ICD-10-CM | POA: Diagnosis not present

## 2021-01-16 DIAGNOSIS — Z681 Body mass index (BMI) 19 or less, adult: Secondary | ICD-10-CM | POA: Diagnosis not present

## 2021-01-16 DIAGNOSIS — M5030 Other cervical disc degeneration, unspecified cervical region: Secondary | ICD-10-CM | POA: Diagnosis not present

## 2021-01-16 DIAGNOSIS — G64 Other disorders of peripheral nervous system: Secondary | ICD-10-CM | POA: Diagnosis not present

## 2021-01-29 DIAGNOSIS — D0339 Melanoma in situ of other parts of face: Secondary | ICD-10-CM | POA: Diagnosis not present

## 2021-02-13 DIAGNOSIS — D0339 Melanoma in situ of other parts of face: Secondary | ICD-10-CM | POA: Diagnosis not present

## 2021-02-16 ENCOUNTER — Other Ambulatory Visit: Payer: Self-pay | Admitting: Gastroenterology

## 2021-02-19 ENCOUNTER — Encounter: Payer: Self-pay | Admitting: Internal Medicine

## 2021-02-19 ENCOUNTER — Telehealth: Payer: Self-pay | Admitting: Internal Medicine

## 2021-02-19 NOTE — Telephone Encounter (Signed)
Already completed

## 2021-02-19 NOTE — Telephone Encounter (Signed)
Refilled but needs office visit.  °

## 2021-02-19 NOTE — Telephone Encounter (Signed)
Pt said that [facility] on [address] told her to call us for her refill of Pantoprazole.

## 2021-03-15 DIAGNOSIS — M5030 Other cervical disc degeneration, unspecified cervical region: Secondary | ICD-10-CM | POA: Diagnosis not present

## 2021-03-15 DIAGNOSIS — N390 Urinary tract infection, site not specified: Secondary | ICD-10-CM | POA: Diagnosis not present

## 2021-03-15 DIAGNOSIS — L4051 Distal interphalangeal psoriatic arthropathy: Secondary | ICD-10-CM | POA: Diagnosis not present

## 2021-03-15 DIAGNOSIS — E2839 Other primary ovarian failure: Secondary | ICD-10-CM | POA: Diagnosis not present

## 2021-03-15 DIAGNOSIS — R0989 Other specified symptoms and signs involving the circulatory and respiratory systems: Secondary | ICD-10-CM | POA: Diagnosis not present

## 2021-03-15 DIAGNOSIS — M810 Age-related osteoporosis without current pathological fracture: Secondary | ICD-10-CM | POA: Diagnosis not present

## 2021-03-15 DIAGNOSIS — F419 Anxiety disorder, unspecified: Secondary | ICD-10-CM | POA: Diagnosis not present

## 2021-03-15 DIAGNOSIS — N3281 Overactive bladder: Secondary | ICD-10-CM | POA: Diagnosis not present

## 2021-03-15 DIAGNOSIS — M1991 Primary osteoarthritis, unspecified site: Secondary | ICD-10-CM | POA: Diagnosis not present

## 2021-03-19 ENCOUNTER — Other Ambulatory Visit (HOSPITAL_COMMUNITY): Payer: Self-pay | Admitting: Internal Medicine

## 2021-03-19 DIAGNOSIS — E2839 Other primary ovarian failure: Secondary | ICD-10-CM

## 2021-03-28 ENCOUNTER — Telehealth: Payer: Self-pay | Admitting: Obstetrics & Gynecology

## 2021-03-28 NOTE — Telephone Encounter (Signed)
Pt called our nurse line, they recommended pt be seen here within 4 hours Pt having pelvic pain, pt told nurse line she was checked a couple weeks ago for UTI that was negative  Please advise & call pt  (no avail appts)

## 2021-03-28 NOTE — Telephone Encounter (Signed)
I called the [phone] #. A female picked up and said I had the wrong #. I called the [phone] # and had to leave a message. I tried the 79 # again and message said voice mail not set up. Will try again later. [place]

## 2021-03-29 ENCOUNTER — Other Ambulatory Visit (INDEPENDENT_AMBULATORY_CARE_PROVIDER_SITE_OTHER): Payer: PPO | Admitting: *Deleted

## 2021-03-29 ENCOUNTER — Other Ambulatory Visit: Payer: Self-pay

## 2021-03-29 VITALS — Temp 97.8°F

## 2021-03-29 DIAGNOSIS — R102 Pelvic and perineal pain: Secondary | ICD-10-CM

## 2021-03-29 DIAGNOSIS — R300 Dysuria: Secondary | ICD-10-CM | POA: Diagnosis not present

## 2021-03-29 LAB — POCT URINALYSIS DIPSTICK
Glucose, UA: NEGATIVE
Ketones, UA: NEGATIVE
Nitrite, UA: NEGATIVE
Protein, UA: NEGATIVE

## 2021-03-29 MED ORDER — AMPICILLIN 500 MG PO CAPS
500.0000 mg | ORAL_CAPSULE | Freq: Three times a day (TID) | ORAL | 0 refills | Status: DC
Start: 2021-03-29 — End: 2024-02-25

## 2021-03-29 NOTE — Telephone Encounter (Signed)
Pt came into office and had a nurse visit for UTI. Med will be sent to pharmacy. [place]

## 2021-03-29 NOTE — Addendum Note (Signed)
Addended by: Derrek Monaco A on: 03/29/2021 11:10 AM   Modules accepted: Orders

## 2021-03-29 NOTE — Progress Notes (Signed)
Chart reviewed for nurse visit. Agree with plan of care. Will rx ampicillin Estill Dooms, NP 03/29/2021 11:09 AM

## 2021-03-29 NOTE — Progress Notes (Signed)
   NURSE VISIT- UTI SYMPTOMS   SUBJECTIVE:  Robin Preston is a 75 y.o. G0P0000 female here for UTI symptoms. She is a GYN patient. She reports  burning with urination, pelvic pain, nausea . Temp 97.8  OBJECTIVE:  There were no vitals taken for this visit.  Appears well, in no apparent distress  Results for orders placed or performed in visit on 03/29/21 (from the past 24 hour(s))  POCT Urinalysis Dipstick   Collection Time: 03/29/21  9:03 AM  Result Value Ref Range   Color, UA     Clarity, UA     Glucose, UA Negative Negative   Bilirubin, UA     Ketones, UA neg    Spec Grav, UA     Blood, UA 3+    pH, UA     Protein, UA Negative Negative   Urobilinogen, UA     Nitrite, UA neg    Leukocytes, UA Large (3+) (A) Negative   Appearance     Odor      ASSESSMENT: GYN patient with UTI symptoms and negative nitrites  PLAN: Discussed with Derrek Monaco, AGNP   Rx sent by provider today: Yes Urine culture sent Call or return to clinic prn if these symptoms worsen or fail to improve as anticipated. I advised Urgent Care over the weekend if pt got worse. Follow-up: as scheduled   Levy Pupa  03/29/2021 9:07 AM

## 2021-03-30 LAB — URINALYSIS, ROUTINE W REFLEX MICROSCOPIC
Bilirubin, UA: NEGATIVE
Glucose, UA: NEGATIVE
Ketones, UA: NEGATIVE
Nitrite, UA: NEGATIVE
Specific Gravity, UA: 1.011 (ref 1.005–1.030)
Urobilinogen, Ur: 0.2 mg/dL (ref 0.2–1.0)
pH, UA: 6.5 (ref 5.0–7.5)

## 2021-03-30 LAB — MICROSCOPIC EXAMINATION
Casts: NONE SEEN /lpf
WBC, UA: >30 /hpf — AB (ref 0–5)

## 2021-04-03 LAB — URINE CULTURE

## 2021-04-05 ENCOUNTER — Other Ambulatory Visit: Payer: Self-pay

## 2021-04-05 ENCOUNTER — Encounter: Payer: Self-pay | Admitting: Adult Health

## 2021-04-05 ENCOUNTER — Ambulatory Visit: Payer: PPO | Admitting: Adult Health

## 2021-04-05 VITALS — BP 139/60 | HR 48 | Ht 61.0 in | Wt 94.8 lb

## 2021-04-05 DIAGNOSIS — R319 Hematuria, unspecified: Secondary | ICD-10-CM

## 2021-04-05 DIAGNOSIS — N949 Unspecified condition associated with female genital organs and menstrual cycle: Secondary | ICD-10-CM

## 2021-04-05 DIAGNOSIS — N952 Postmenopausal atrophic vaginitis: Secondary | ICD-10-CM | POA: Diagnosis not present

## 2021-04-05 DIAGNOSIS — N9489 Other specified conditions associated with female genital organs and menstrual cycle: Secondary | ICD-10-CM | POA: Insufficient documentation

## 2021-04-05 DIAGNOSIS — Z87440 Personal history of urinary (tract) infections: Secondary | ICD-10-CM

## 2021-04-05 LAB — POCT URINALYSIS DIPSTICK OB
Blood, UA: NEGATIVE
Glucose, UA: NEGATIVE
Ketones, UA: NEGATIVE
Leukocytes, UA: NEGATIVE
Nitrite, UA: NEGATIVE
POC,PROTEIN,UA: NEGATIVE

## 2021-04-05 MED ORDER — PREMARIN 0.625 MG/GM VA CREA
TOPICAL_CREAM | VAGINAL | 0 refills | Status: DC
Start: 2021-04-05 — End: 2024-02-25

## 2021-04-05 NOTE — Progress Notes (Signed)
  Subjective:     Patient ID: ALDEA AVIS, female   DOB: Oct 03, 1945, 75 y.o.   MRN: [id number]  HPI Moyinoluwa is a 75 year old white female, divorced, sp hysterectomy in complaining of vaginal burning, was treated with ampicillin for UTI recently and that is better. PCP is Dr Hilma Favors.   Review of Systems +burning in vagina Reviewed past medical,surgical, social and family history. Reviewed medications and allergies.     Objective:   Physical Exam BP 139/60 (BP Location: Right Arm, Patient Position: Sitting, Cuff Size: Normal)   Pulse (!) 48   Ht 5\' 1"  (1.549 m)   Wt 94 lb 12.8 oz (43 kg)   BMI 17.91 kg/m  urine dipstick +blood. Skin warm and dry.Pelvic: external genitalia is normal in appearance no lesions, vagina: pale and atrophic, has some mild redness at introitus,urethra has no lesions or masses noted, cervix and uterus are absent,adnexa: no masses or tenderness noted. Bladder is non tender and no masses felt.  Examination chaperoned by Glenard Haring RN     Assessment:     1. Hematuria, unspecified type  2. History of UTI   3. Vaginal burning Will try premarin vaginal cream , gave 8 gms to use pea sized amount at introitus daily x 1 week then 2-3 x weekly  4. Vaginal atrophy     Plan:     Follow up prn

## 2021-04-25 DIAGNOSIS — G894 Chronic pain syndrome: Secondary | ICD-10-CM | POA: Diagnosis not present

## 2021-04-25 DIAGNOSIS — G629 Polyneuropathy, unspecified: Secondary | ICD-10-CM | POA: Diagnosis not present

## 2021-04-25 DIAGNOSIS — M1991 Primary osteoarthritis, unspecified site: Secondary | ICD-10-CM | POA: Diagnosis not present

## 2021-04-25 DIAGNOSIS — Z681 Body mass index (BMI) 19 or less, adult: Secondary | ICD-10-CM | POA: Diagnosis not present

## 2021-04-25 DIAGNOSIS — L4051 Distal interphalangeal psoriatic arthropathy: Secondary | ICD-10-CM | POA: Diagnosis not present

## 2021-04-25 DIAGNOSIS — G64 Other disorders of peripheral nervous system: Secondary | ICD-10-CM | POA: Diagnosis not present

## 2021-04-25 DIAGNOSIS — M5030 Other cervical disc degeneration, unspecified cervical region: Secondary | ICD-10-CM | POA: Diagnosis not present

## 2021-07-02 DIAGNOSIS — G894 Chronic pain syndrome: Secondary | ICD-10-CM | POA: Diagnosis not present

## 2021-07-02 DIAGNOSIS — Z681 Body mass index (BMI) 19 or less, adult: Secondary | ICD-10-CM | POA: Diagnosis not present

## 2021-07-02 DIAGNOSIS — L4051 Distal interphalangeal psoriatic arthropathy: Secondary | ICD-10-CM | POA: Diagnosis not present

## 2021-07-02 DIAGNOSIS — M1991 Primary osteoarthritis, unspecified site: Secondary | ICD-10-CM | POA: Diagnosis not present

## 2021-07-02 DIAGNOSIS — M5030 Other cervical disc degeneration, unspecified cervical region: Secondary | ICD-10-CM | POA: Diagnosis not present

## 2021-07-22 DIAGNOSIS — G894 Chronic pain syndrome: Secondary | ICD-10-CM | POA: Diagnosis not present

## 2021-07-22 DIAGNOSIS — M1991 Primary osteoarthritis, unspecified site: Secondary | ICD-10-CM | POA: Diagnosis not present

## 2021-07-22 DIAGNOSIS — M5030 Other cervical disc degeneration, unspecified cervical region: Secondary | ICD-10-CM | POA: Diagnosis not present

## 2021-07-22 DIAGNOSIS — H538 Other visual disturbances: Secondary | ICD-10-CM | POA: Diagnosis not present

## 2021-07-22 DIAGNOSIS — Z681 Body mass index (BMI) 19 or less, adult: Secondary | ICD-10-CM | POA: Diagnosis not present

## 2021-07-22 DIAGNOSIS — L4051 Distal interphalangeal psoriatic arthropathy: Secondary | ICD-10-CM | POA: Diagnosis not present

## 2021-08-20 DIAGNOSIS — Z681 Body mass index (BMI) 19 or less, adult: Secondary | ICD-10-CM | POA: Diagnosis not present

## 2021-08-20 DIAGNOSIS — G894 Chronic pain syndrome: Secondary | ICD-10-CM | POA: Diagnosis not present

## 2021-08-20 DIAGNOSIS — H01006 Unspecified blepharitis left eye, unspecified eyelid: Secondary | ICD-10-CM | POA: Diagnosis not present

## 2021-09-17 DIAGNOSIS — N3281 Overactive bladder: Secondary | ICD-10-CM | POA: Diagnosis not present

## 2021-09-17 DIAGNOSIS — Z0001 Encounter for general adult medical examination with abnormal findings: Secondary | ICD-10-CM | POA: Diagnosis not present

## 2021-09-17 DIAGNOSIS — Z681 Body mass index (BMI) 19 or less, adult: Secondary | ICD-10-CM | POA: Diagnosis not present

## 2021-09-17 DIAGNOSIS — Z1331 Encounter for screening for depression: Secondary | ICD-10-CM | POA: Diagnosis not present

## 2021-09-17 DIAGNOSIS — M2550 Pain in unspecified joint: Secondary | ICD-10-CM | POA: Diagnosis not present

## 2021-09-17 DIAGNOSIS — E0590 Thyrotoxicosis, unspecified without thyrotoxic crisis or storm: Secondary | ICD-10-CM | POA: Diagnosis not present

## 2021-09-17 DIAGNOSIS — M353 Polymyalgia rheumatica: Secondary | ICD-10-CM | POA: Diagnosis not present

## 2021-09-24 ENCOUNTER — Other Ambulatory Visit: Payer: Self-pay | Admitting: Gastroenterology

## 2021-10-07 ENCOUNTER — Encounter: Payer: Self-pay | Admitting: Gastroenterology

## 2021-10-07 ENCOUNTER — Other Ambulatory Visit: Payer: Self-pay | Admitting: Gastroenterology

## 2021-10-07 NOTE — Telephone Encounter (Signed)
Patient needs office visit for refill last OV Oct 2020. Can be virtual ?

## 2021-11-27 DIAGNOSIS — G64 Other disorders of peripheral nervous system: Secondary | ICD-10-CM | POA: Diagnosis not present

## 2021-11-27 DIAGNOSIS — R5383 Other fatigue: Secondary | ICD-10-CM | POA: Diagnosis not present

## 2021-11-27 DIAGNOSIS — M2550 Pain in unspecified joint: Secondary | ICD-10-CM | POA: Diagnosis not present

## 2021-11-27 DIAGNOSIS — Z681 Body mass index (BMI) 19 or less, adult: Secondary | ICD-10-CM | POA: Diagnosis not present

## 2021-11-27 DIAGNOSIS — G629 Polyneuropathy, unspecified: Secondary | ICD-10-CM | POA: Diagnosis not present

## 2021-11-27 DIAGNOSIS — M1991 Primary osteoarthritis, unspecified site: Secondary | ICD-10-CM | POA: Diagnosis not present

## 2021-11-27 DIAGNOSIS — R42 Dizziness and giddiness: Secondary | ICD-10-CM | POA: Diagnosis not present

## 2022-01-16 DIAGNOSIS — G629 Polyneuropathy, unspecified: Secondary | ICD-10-CM | POA: Diagnosis not present

## 2022-01-16 DIAGNOSIS — G894 Chronic pain syndrome: Secondary | ICD-10-CM | POA: Diagnosis not present

## 2022-01-16 DIAGNOSIS — M5030 Other cervical disc degeneration, unspecified cervical region: Secondary | ICD-10-CM | POA: Diagnosis not present

## 2022-01-16 DIAGNOSIS — G43C0 Periodic headache syndromes in child or adult, not intractable: Secondary | ICD-10-CM | POA: Diagnosis not present

## 2022-01-16 DIAGNOSIS — R42 Dizziness and giddiness: Secondary | ICD-10-CM | POA: Diagnosis not present

## 2022-01-16 DIAGNOSIS — Z681 Body mass index (BMI) 19 or less, adult: Secondary | ICD-10-CM | POA: Diagnosis not present

## 2022-01-20 ENCOUNTER — Other Ambulatory Visit (HOSPITAL_COMMUNITY): Payer: Self-pay | Admitting: Internal Medicine

## 2022-01-20 ENCOUNTER — Other Ambulatory Visit: Payer: Self-pay | Admitting: Internal Medicine

## 2022-01-20 DIAGNOSIS — H539 Unspecified visual disturbance: Secondary | ICD-10-CM

## 2022-01-20 DIAGNOSIS — M9901 Segmental and somatic dysfunction of cervical region: Secondary | ICD-10-CM | POA: Diagnosis not present

## 2022-01-20 DIAGNOSIS — M26601 Right temporomandibular joint disorder, unspecified: Secondary | ICD-10-CM | POA: Diagnosis not present

## 2022-01-20 DIAGNOSIS — G43C1 Periodic headache syndromes in child or adult, intractable: Secondary | ICD-10-CM

## 2022-01-20 DIAGNOSIS — R42 Dizziness and giddiness: Secondary | ICD-10-CM

## 2022-01-24 DIAGNOSIS — M26601 Right temporomandibular joint disorder, unspecified: Secondary | ICD-10-CM | POA: Diagnosis not present

## 2022-01-24 DIAGNOSIS — M9901 Segmental and somatic dysfunction of cervical region: Secondary | ICD-10-CM | POA: Diagnosis not present

## 2022-01-28 DIAGNOSIS — Z0001 Encounter for general adult medical examination with abnormal findings: Secondary | ICD-10-CM | POA: Diagnosis not present

## 2022-01-28 DIAGNOSIS — M810 Age-related osteoporosis without current pathological fracture: Secondary | ICD-10-CM | POA: Diagnosis not present

## 2022-01-29 DIAGNOSIS — M9901 Segmental and somatic dysfunction of cervical region: Secondary | ICD-10-CM | POA: Diagnosis not present

## 2022-01-29 DIAGNOSIS — M26601 Right temporomandibular joint disorder, unspecified: Secondary | ICD-10-CM | POA: Diagnosis not present

## 2022-02-11 ENCOUNTER — Encounter (HOSPITAL_COMMUNITY): Payer: Self-pay

## 2022-02-11 ENCOUNTER — Ambulatory Visit (HOSPITAL_COMMUNITY): Admission: RE | Admit: 2022-02-11 | Payer: PPO | Source: Ambulatory Visit

## 2022-02-11 DIAGNOSIS — M26601 Right temporomandibular joint disorder, unspecified: Secondary | ICD-10-CM | POA: Diagnosis not present

## 2022-02-11 DIAGNOSIS — M9901 Segmental and somatic dysfunction of cervical region: Secondary | ICD-10-CM | POA: Diagnosis not present

## 2022-02-19 ENCOUNTER — Ambulatory Visit (HOSPITAL_COMMUNITY)
Admission: RE | Admit: 2022-02-19 | Discharge: 2022-02-19 | Disposition: A | Discharge status: Home / Self Care | Payer: PPO | Source: Ambulatory Visit | Attending: Internal Medicine | Admitting: Internal Medicine

## 2022-02-19 DIAGNOSIS — H539 Unspecified visual disturbance: Secondary | ICD-10-CM | POA: Diagnosis not present

## 2022-02-19 DIAGNOSIS — R42 Dizziness and giddiness: Secondary | ICD-10-CM | POA: Diagnosis not present

## 2022-02-19 DIAGNOSIS — G43C1 Periodic headache syndromes in child or adult, intractable: Secondary | ICD-10-CM | POA: Insufficient documentation

## 2022-04-15 DIAGNOSIS — K219 Gastro-esophageal reflux disease without esophagitis: Secondary | ICD-10-CM | POA: Diagnosis not present

## 2022-04-15 DIAGNOSIS — G629 Polyneuropathy, unspecified: Secondary | ICD-10-CM | POA: Diagnosis not present

## 2022-04-15 DIAGNOSIS — G894 Chronic pain syndrome: Secondary | ICD-10-CM | POA: Diagnosis not present

## 2022-04-15 DIAGNOSIS — G43C0 Periodic headache syndromes in child or adult, not intractable: Secondary | ICD-10-CM | POA: Diagnosis not present

## 2022-04-15 DIAGNOSIS — Z681 Body mass index (BMI) 19 or less, adult: Secondary | ICD-10-CM | POA: Diagnosis not present

## 2022-04-15 DIAGNOSIS — M2550 Pain in unspecified joint: Secondary | ICD-10-CM | POA: Diagnosis not present

## 2022-04-15 DIAGNOSIS — R5383 Other fatigue: Secondary | ICD-10-CM | POA: Diagnosis not present

## 2022-04-15 DIAGNOSIS — R42 Dizziness and giddiness: Secondary | ICD-10-CM | POA: Diagnosis not present

## 2022-04-15 DIAGNOSIS — M5030 Other cervical disc degeneration, unspecified cervical region: Secondary | ICD-10-CM | POA: Diagnosis not present

## 2022-04-15 DIAGNOSIS — F419 Anxiety disorder, unspecified: Secondary | ICD-10-CM | POA: Diagnosis not present

## 2022-05-05 ENCOUNTER — Emergency Department (HOSPITAL_COMMUNITY)
Admission: EM | Admit: 2022-05-05 | Discharge: 2022-05-05 | Disposition: A | Discharge status: Home / Self Care | Payer: PPO | Attending: Emergency Medicine | Admitting: Emergency Medicine

## 2022-05-05 ENCOUNTER — Emergency Department (HOSPITAL_COMMUNITY): Payer: PPO

## 2022-05-05 ENCOUNTER — Other Ambulatory Visit: Payer: Self-pay

## 2022-05-05 ENCOUNTER — Encounter (HOSPITAL_COMMUNITY): Payer: Self-pay

## 2022-05-05 DIAGNOSIS — N390 Urinary tract infection, site not specified: Secondary | ICD-10-CM | POA: Diagnosis not present

## 2022-05-05 DIAGNOSIS — R55 Syncope and collapse: Secondary | ICD-10-CM | POA: Insufficient documentation

## 2022-05-05 DIAGNOSIS — S3993XA Unspecified injury of pelvis, initial encounter: Secondary | ICD-10-CM | POA: Diagnosis not present

## 2022-05-05 DIAGNOSIS — R531 Weakness: Secondary | ICD-10-CM | POA: Diagnosis not present

## 2022-05-05 DIAGNOSIS — D72829 Elevated white blood cell count, unspecified: Secondary | ICD-10-CM | POA: Insufficient documentation

## 2022-05-05 DIAGNOSIS — M4802 Spinal stenosis, cervical region: Secondary | ICD-10-CM | POA: Diagnosis not present

## 2022-05-05 DIAGNOSIS — M47812 Spondylosis without myelopathy or radiculopathy, cervical region: Secondary | ICD-10-CM | POA: Diagnosis not present

## 2022-05-05 DIAGNOSIS — R3915 Urgency of urination: Secondary | ICD-10-CM | POA: Diagnosis present

## 2022-05-05 DIAGNOSIS — R5383 Other fatigue: Secondary | ICD-10-CM | POA: Insufficient documentation

## 2022-05-05 LAB — CBC WITH DIFFERENTIAL/PLATELET
Abs Immature Granulocytes: 0.02 10*3/uL (ref 0.00–0.07)
Basophils Absolute: 0.1 10*3/uL (ref 0.0–0.1)
Basophils Relative: 1 %
Eosinophils Absolute: 0.2 10*3/uL (ref 0.0–0.5)
Eosinophils Relative: 4 %
HCT: 36.8 % (ref 36.0–46.0)
Hemoglobin: 11.9 g/dL — ABNORMAL LOW (ref 12.0–15.0)
Immature Granulocytes: 0 %
Lymphocytes Relative: 28 %
Lymphs Abs: 1.4 10*3/uL (ref 0.7–4.0)
MCH: 29.6 pg (ref 26.0–34.0)
MCHC: 32.3 g/dL (ref 30.0–36.0)
MCV: 91.5 fL (ref 80.0–100.0)
Monocytes Absolute: 0.4 10*3/uL (ref 0.1–1.0)
Monocytes Relative: 8 %
Neutro Abs: 2.8 10*3/uL (ref 1.7–7.7)
Neutrophils Relative %: 59 %
Platelets: 239 10*3/uL (ref 150–400)
RBC: 4.02 MIL/uL (ref 3.87–5.11)
RDW: 12.7 % (ref 11.5–15.5)
WBC: 4.8 10*3/uL (ref 4.0–10.5)
nRBC: 0 % (ref 0.0–0.2)

## 2022-05-05 LAB — COMPREHENSIVE METABOLIC PANEL
ALT: 14 U/L (ref 0–44)
AST: 22 U/L (ref 15–41)
Albumin: 4.2 g/dL (ref 3.5–5.0)
Alkaline Phosphatase: 57 U/L (ref 38–126)
Anion gap: 7 (ref 5–15)
BUN: 14 mg/dL (ref 8–23)
CO2: 25 mmol/L (ref 22–32)
Calcium: 9.3 mg/dL (ref 8.9–10.3)
Chloride: 103 mmol/L (ref 98–111)
Creatinine, Ser: 0.97 mg/dL (ref 0.44–1.00)
GFR, Estimated: >60 mL/min (ref >60)
Glucose, Bld: 114 mg/dL — ABNORMAL HIGH (ref 70–99)
Potassium: 3.9 mmol/L (ref 3.5–5.1)
Sodium: 135 mmol/L (ref 135–145)
Total Bilirubin: 0.5 mg/dL (ref 0.3–1.2)
Total Protein: 7.1 g/dL (ref 6.5–8.1)

## 2022-05-05 LAB — URINALYSIS, ROUTINE W REFLEX MICROSCOPIC
Bilirubin Urine: NEGATIVE
Glucose, UA: NEGATIVE mg/dL
Hgb urine dipstick: NEGATIVE
Ketones, ur: NEGATIVE mg/dL
Nitrite: POSITIVE — AB
Protein, ur: NEGATIVE mg/dL
Specific Gravity, Urine: 1.006 (ref 1.005–1.030)
pH: 5 (ref 5.0–8.0)

## 2022-05-05 LAB — LIPASE, BLOOD: Lipase: 27 U/L (ref 11–51)

## 2022-05-05 LAB — TROPONIN I (HIGH SENSITIVITY)
Troponin I (High Sensitivity): 5 ng/L (ref <18)
Troponin I (High Sensitivity): 6 ng/L (ref <18)

## 2022-05-05 MED ORDER — CEFDINIR 300 MG PO CAPS: 300.0000 mg | ORAL_CAPSULE | Freq: Two times a day (BID) | ORAL | 0 refills | Status: AC

## 2022-05-05 MED ORDER — LACTATED RINGERS IV BOLUS
1000.0000 mL | Freq: Once | INTRAVENOUS | Status: AC
  Administered 2022-05-05: 1000 mL via INTRAVENOUS

## 2022-05-05 MED ORDER — ONDANSETRON HCL 4 MG PO TABS: 4.0000 mg | ORAL_TABLET | Freq: Four times a day (QID) | ORAL | 0 refills | Status: DC | PRN

## 2022-05-05 MED ORDER — SODIUM CHLORIDE 0.9 % IV SOLN
1.0000 g | Freq: Once | INTRAVENOUS | Status: AC
  Administered 2022-05-05: 1 g via INTRAVENOUS
  Filled 2022-05-05: qty 10

## 2022-05-05 NOTE — ED Provider Notes (Signed)
[facility] EMERGENCY DEPARTMENT Provider Note   CSN: [id number] Arrival date & time: 05/05/22  7408     History Chief Complaint  Patient presents with   Loss of Consciousness    HPI Robin Preston is a 76 y.o. female presenting for syncope episode.  Patient has a substantial history of hyperthyroid, laryngeal pharyngeal reflux and resulting globus as well as dysphagia. She endorses an episode of syncope on Wednesday and resulting fatigue since then. She states that she has a history of recurrent urinary tract infections and has been on antibiotics intermittently throughout her life.  She is currently asymptomatic and has been doing well overall well since Wednesday.  She endorses urinary urgency and some pressure during urination.  Patient's recorded medical, surgical, social, medication list and allergies were reviewed in the Snapshot window as part of the initial history.   Review of Systems   Review of Systems  Constitutional:  Positive for fatigue. Negative for chills and fever.  HENT:  Negative for ear pain and sore throat.   Eyes:  Negative for pain and visual disturbance.  Respiratory:  Negative for cough and shortness of breath.   Cardiovascular:  Negative for chest pain and palpitations.  Gastrointestinal:  Negative for abdominal pain and vomiting.  Genitourinary:  Negative for dysuria and hematuria.  Musculoskeletal:  Negative for arthralgias and back pain.  Skin:  Negative for color change and rash.  Neurological:  Negative for seizures and syncope.  Psychiatric/Behavioral:  Positive for confusion.   All other systems reviewed and are negative.   Physical Exam Updated Vital Signs BP (!) 171/69 (BP Location: Right Arm)   Pulse (!) 56   Temp 98.1 F (36.7 C) (Oral)   Resp 19   Ht '5\' 2"'$  (1.575 m)   Wt 40.8 kg   SpO2 97%   BMI 16.46 kg/m  Physical Exam Vitals and nursing note reviewed.  Constitutional:      General: She is not in acute distress.     Appearance: She is well-developed.  HENT:     Head: Normocephalic and atraumatic.  Eyes:     Conjunctiva/sclera: Conjunctivae normal.  Cardiovascular:     Rate and Rhythm: Normal rate and regular rhythm.     Heart sounds: No murmur heard. Pulmonary:     Effort: Pulmonary effort is normal. No respiratory distress.     Breath sounds: Normal breath sounds.  Abdominal:     Palpations: Abdomen is soft.     Tenderness: There is no abdominal tenderness. There is no right CVA tenderness or left CVA tenderness.  Musculoskeletal:        General: No swelling.     Cervical back: Neck supple.  Skin:    General: Skin is warm and dry.     Capillary Refill: Capillary refill takes less than 2 seconds.  Neurological:     Mental Status: She is alert.  Psychiatric:        Mood and Affect: Mood normal.      ED Course/ Medical Decision Making/ A&P Clinical Course as of 05/05/22 1626  Mon May 05, 2022  1315 ABX and RA [CC]    Clinical Course User Index [CC] Tretha Sciara, MD    Procedures Procedures   Medications Ordered in ED Medications  cefTRIAXone (ROCEPHIN) 1 g in sodium chloride 0.9 % 100 mL IVPB (0 g Intravenous Stopped 05/05/22 1306)  lactated ringers bolus 1,000 mL (0 mLs Intravenous Stopped 05/05/22 1553)    Medical Decision Making:  Robin Preston is a 76 y.o. female who presented to the ED today with fatigue and weakness detailed above.     Patient's presentation is complicated by their history of advanced age, multiple comorbid medical problems.  Patient placed on continuous vitals and telemetry monitoring while in ED which was reviewed periodically.   Complete initial physical exam performed, notably the patient  was hemodynamically stable in no acute distress.  She is ambulatory tolerating p.o. intake.      Reviewed and confirmed nursing documentation for past medical history, family history, social history.    Initial Assessment:   With the patient's  presentation of weakness and fatigue, most likely diagnosis is infection including urinary tract infection or pneumonia. Other diagnoses were considered including (but not limited to) sepsis, metabolic disruption, endocrinologic emergency. These are considered less likely due to history of present illness and physical exam findings.   This is most consistent with an acute life/limb threatening illness complicated by underlying chronic conditions.  Initial Plan:  Screening labs including CBC and Metabolic panel to evaluate for infectious or metabolic etiology of disease.  Urinalysis with reflex culture ordered to evaluate for UTI or relevant urologic/nephrologic pathology.  CXR to evaluate for structural/infectious intrathoracic pathology.  Troponin and EKG to evaluate for cardiac pathology. Lipase to evaluate for pancreatitis Given fall, concern for traumatic pathology, will evaluate with CT head, CT C-spine, portable echo Saray pelvis and chest. Objective evaluation as below reviewed with plan for close reassessment  Initial Study Results:   Laboratory  All laboratory results reviewed without evidence of clinically relevant pathology.   Exceptions include: leukocytosis, bacteriuria  EKG EKG was reviewed independently. Rate, rhythm, axis, intervals all examined and without medically relevant abnormality. ST segments without concerns for elevations.    Radiology  All images reviewed independently. Agree with radiology report at this time.   CT CERVICAL SPINE WO CONTRAST  Result Date: 05/05/2022 CLINICAL DATA:  Syncopal episode with loss of consciousness. Progressive weakness. EXAM: CT CERVICAL SPINE WITHOUT CONTRAST TECHNIQUE: Multidetector CT imaging of the cervical spine was performed without intravenous contrast. Multiplanar CT image reconstructions were also generated. RADIATION DOSE REDUCTION: This exam was performed according to the departmental dose-optimization program which includes  automated exposure control, adjustment of the mA and/or kV according to patient size and/or use of iterative reconstruction technique. COMPARISON:  None Available. FINDINGS: Alignment: No significant listhesis is present. Mild straightening of the normal cervical lordosis is present. Skull base and vertebrae: Craniocervical junction is within normal limits. Vertebral body heights are normal. No acute or healing fractures are present. Soft tissues and spinal canal: No prevertebral fluid or swelling. No visible canal hematoma. Disc levels: Uncovertebral spurring contributes to right greater than left foraminal narrowing at C3-4 and left greater than right foraminal narrowing at C4-5 and C5-6. Upper chest: Lung apices are clear. The thoracic inlet is within normal limits. IMPRESSION: 1. No acute fracture or traumatic subluxation. 2. Multilevel degenerative changes of the cervical spine as described. Electronically Signed   By: San Morelle M.D.   On: 05/05/2022 12:16   CT HEAD WO CONTRAST (5MM)  Result Date: 05/05/2022 CLINICAL DATA:  Head trauma. Syncopal episode with loss of consciousness. Patient does not know how long she was down. EXAM: CT HEAD WITHOUT CONTRAST TECHNIQUE: Contiguous axial images were obtained from the base of the skull through the vertex without intravenous contrast. RADIATION DOSE REDUCTION: This exam was performed according to the departmental dose-optimization program which includes automated exposure control, adjustment  of the mA and/or kV according to patient size and/or use of iterative reconstruction technique. COMPARISON:  CT head without contrast 02/19/2022 FINDINGS: Brain: No acute infarct, hemorrhage, or mass lesion is present. Mild periventricular white matter changes are stable. The ventricles are of normal size. No significant extraaxial fluid collection is present. Deep brain nuclei are within normal limits. The brainstem and cerebellum are within normal limits.  Vascular: Minimal vascular calcifications are present. No hyperdense vessel is present. Skull: Calvarium is intact. No focal lytic or blastic lesions are present. No significant extracranial soft tissue lesion is present. Sinuses/Orbits: Small mastoid effusions are present, left greater than right. No obstructing nasopharyngeal lesion is present. The paranasal sinuses and mastoid air cells are otherwise clear. Bilateral lens replacements are noted. Globes and orbits are otherwise unremarkable. IMPRESSION: 1. No acute intracranial abnormality or significant interval change. 2. Stable mild periventricular white matter disease. This likely reflects the sequela of chronic microvascular ischemia. 3. Small mastoid effusions, left greater than right. No obstructing nasopharyngeal lesion is present. Electronically Signed   By: San Morelle M.D.   On: 05/05/2022 12:14   DG Pelvis Portable  Result Date: 05/05/2022 CLINICAL DATA:  Blunt Polytrauma EXAM: PORTABLE PELVIS 1-2 VIEWS COMPARISON:  None Available. FINDINGS: Left pelvis obscured by overlying leads. No pelvic fracture or diastasis. No evidence of hip dislocation on this single frontal view. No suspicious focal osseous lesions. IMPRESSION: No pelvic fracture or diastasis. Left pelvis obscured by overlying leads. Electronically Signed   By: Ilona Sorrel M.D.   On: 05/05/2022 10:15   DG Chest Portable 1 View  Result Date: 05/05/2022 CLINICAL DATA:  Blunt poly trauma, loss of consciousness yesterday EXAM: PORTABLE CHEST 1 VIEW COMPARISON:  08/30/2019 chest radiograph. FINDINGS: Stable cardiomediastinal silhouette with normal heart size. No pneumothorax. No pleural effusion. Lungs appear clear, with no acute consolidative airspace disease and no pulmonary edema. No displaced fractures. IMPRESSION: No active disease. Electronically Signed   By: Ilona Sorrel M.D.   On: 05/05/2022 10:14     Final Assessment and Plan:   Given her syncopal event earlier in  the week, I recommended admission.  However patient declined.  She stated that she would rather be treated in the outpatient setting.  She agreed to stay for initiation on IV antibiotics and reassessment of ambulation status.  On reassessment, she is able to ambulate and is tolerating p.o. intake.  No acute indication for further emergent intervention in the emergency department, given her clinical improvement.  Strict return precautions regarding recurrent deterioration reinforced and patient expressed understanding.  Supportive care reinforced and patient discharged with no further acute events.   Disposition:  I have considered need for hospitalization, however, considering all of the above, I believe this patient is stable for discharge at this time.  Patient/family educated about specific return precautions for given chief complaint and symptoms.  Patient/family educated about follow-up with PCP.     Patient/family expressed understanding of return precautions and need for follow-up. Patient spoken to regarding all imaging and laboratory results and appropriate follow up for these results. All education provided in verbal form with additional information in written form. Time was allowed for answering of patient questions. Patient discharged.    Emergency Department Medication Summary:   Medications  cefTRIAXone (ROCEPHIN) 1 g in sodium chloride 0.9 % 100 mL IVPB (0 g Intravenous Stopped 05/05/22 1306)  lactated ringers bolus 1,000 mL (0 mLs Intravenous Stopped 05/05/22 1553)  Clinical Impression:  1. Urinary tract infection without hematuria, site unspecified      Discharge   Final Clinical Impression(s) / ED Diagnoses Final diagnoses:  Urinary tract infection without hematuria, site unspecified    Rx / DC Orders ED Discharge Orders          Ordered    cefdinir (OMNICEF) 300 MG capsule  2 times daily        05/05/22 1448    ondansetron (ZOFRAN) 4 MG tablet  Every 6  hours PRN        05/05/22 1448              Tretha Sciara, MD 05/05/22 1627

## 2022-05-05 NOTE — ED Triage Notes (Signed)
Pt presents to ED with LOC which happened last Wednesday, unsure how long LOC. Pt states she remembers having a bad headache and shaking then she said she had the LOC and remembers waking up. Pt has had increased weakness since last Wednesday and headache. Pt states she will try to get up and she starts shaking and her body gives out.

## 2022-05-05 NOTE — ED Notes (Signed)
Pt requesting OJ and EDP approved request. OJ given to pt.

## 2022-05-05 NOTE — ED Notes (Signed)
Pt is aware that a urine sample is needed, Pt states that she can not go at this time

## 2022-05-05 NOTE — ED Notes (Signed)
Notified MD of pt vital signs

## 2022-05-09 DIAGNOSIS — Z681 Body mass index (BMI) 19 or less, adult: Secondary | ICD-10-CM | POA: Diagnosis not present

## 2022-05-09 DIAGNOSIS — M5030 Other cervical disc degeneration, unspecified cervical region: Secondary | ICD-10-CM | POA: Diagnosis not present

## 2022-05-09 DIAGNOSIS — N390 Urinary tract infection, site not specified: Secondary | ICD-10-CM | POA: Diagnosis not present

## 2022-05-09 DIAGNOSIS — G43C0 Periodic headache syndromes in child or adult, not intractable: Secondary | ICD-10-CM | POA: Diagnosis not present

## 2022-05-09 DIAGNOSIS — G64 Other disorders of peripheral nervous system: Secondary | ICD-10-CM | POA: Diagnosis not present

## 2022-05-09 DIAGNOSIS — R42 Dizziness and giddiness: Secondary | ICD-10-CM | POA: Diagnosis not present

## 2022-05-09 DIAGNOSIS — R55 Syncope and collapse: Secondary | ICD-10-CM | POA: Diagnosis not present

## 2022-05-09 DIAGNOSIS — R0989 Other specified symptoms and signs involving the circulatory and respiratory systems: Secondary | ICD-10-CM | POA: Diagnosis not present

## 2022-05-27 ENCOUNTER — Encounter: Payer: Self-pay | Admitting: Neurology

## 2022-05-27 ENCOUNTER — Telehealth: Payer: Self-pay | Admitting: Neurology

## 2022-05-27 ENCOUNTER — Ambulatory Visit (INDEPENDENT_AMBULATORY_CARE_PROVIDER_SITE_OTHER): Payer: PPO | Admitting: Neurology

## 2022-05-27 VITALS — BP 94/53 | HR 46 | Ht 61.0 in | Wt 89.2 lb

## 2022-05-27 DIAGNOSIS — Z79899 Other long term (current) drug therapy: Secondary | ICD-10-CM

## 2022-05-27 DIAGNOSIS — R636 Underweight: Secondary | ICD-10-CM | POA: Diagnosis not present

## 2022-05-27 DIAGNOSIS — R413 Other amnesia: Secondary | ICD-10-CM

## 2022-05-27 DIAGNOSIS — R410 Disorientation, unspecified: Secondary | ICD-10-CM

## 2022-05-27 DIAGNOSIS — R2689 Other abnormalities of gait and mobility: Secondary | ICD-10-CM | POA: Diagnosis not present

## 2022-05-27 NOTE — Telephone Encounter (Signed)
MR brain w/wo contrast sent to GI for scheduling.  [employer] no auth needed.

## 2022-05-27 NOTE — Progress Notes (Signed)
Subjective:    Patient ID: Robin Preston is a 76 y.o. female.  HPI    Star Age, MD, PhD [facility] [address] [place], [place] [address]  Dear Dr. Gerarda Fraction,   I saw your patient, Robin Preston, upon your kind request, in my Neurologic clinic today for initial consultation of her memory loss.  The patient is accompanied by her son, Robin Preston, today.  As you know, Robin Preston is a 76 year old female with an underlying medical history of acid reflux, osteoarthritis, cervical disc disease, vertigo, anxiety, chronic pain, on chronic narcotic pain medication, headaches, OAB, low BMI, thyroid dysfunction, and dizziness, who reports a 1 month history of sudden onset of balance issues.  She has not felt stable with her gait since then, she uses a cane typically outside the house, not inside the home.  She lives alone, she is single, she has 1 son.  He lives about 17 miles away.  Of note, she recently went to the emergency room on 05/05/2022 after a fall.  She was diagnosed with a UTI.  Per son, she has had difficulty with her gait and balance and intermittent slurring of speech for the past nearly 2 years.  She does currently drive, but not often or far, son reports that she should not be driving in his opinion.  She had a head CT and cervical spine CT without contrast on 05/05/2022 and I reviewed the results: IMPRESSION: 1. No acute fracture or traumatic subluxation. 2. Multilevel degenerative changes of the cervical spine as described. I reviewed your office note from 04/15/2022.  She was noted to have an unstable gait.  She had blood work through your office on 04/16/2022, her vitamin B12 level was on the lower end of the spectrum, BUN and creatinine normal, TSH normal. She admits to not drinking very much water, maybe 2 cups/day, she drinks 1 soda per day on average, no alcohol, she is a non-smoker.  She admits that she does not eat very much.  She had Ensure but  has not been drinking any currently.  She finished antibiotic treatment, she was also treated with IV ceftriaxone in the emergency room on 05/05/2022. Of note, she takes oxybutynin every day, she takes diazepam 2 mg strength up to 3 times a day, she has a prescription for oxycodone, she reports that she takes it once every few weeks.  She usually goes to bed after taking it.  Her Past Medical History Is Significant For: Past Medical History:  Diagnosis Date   Acid reflux    Osteoarthritis     Her Past Surgical History Is Significant For: Past Surgical History:  Procedure Laterality Date   ABDOMINAL HYSTERECTOMY     CATARACT EXTRACTION W/PHACO Right 10/04/2012   Procedure: CATARACT EXTRACTION PHACO AND INTRAOCULAR LENS PLACEMENT (IOC);  Surgeon: Tonny Branch, MD;  Location: [facility];  Service: Ophthalmology;  Laterality: Right;  CDE:  15.78   COLONOSCOPY W/ POLYPECTOMY  01/2010   internal hemorrhoids;9 mm rectal tubular adenoma. next tcs 2018. INADEQUATE CONSCIOUS SEDATION PER PATIENT   ESOPHAGOGASTRODUODENOSCOPY (EGD) WITH PROPOFOL N/A 04/21/2016   Dr. [name]: Normal esophagus s/p Maloney dilation, unable to exclude occult cervical esophageal web.    FINGER ARTHRODESIS Right 06/06/2014   Procedure: ARTHRODESIS RIGHT THUMB INTRAPHALANGEAL JOINT;  Surgeon: Leanora Cover, MD;  Location: [facility];  Service: Orthopedics;  Laterality: Right;   IRRIGATION AND DEBRIDEMENT ABSCESS Left 05/02/2014   Procedure: MINOR INCISION AND DRAINAGE  OF LEFT RING FINGER ABSCESS;  Surgeon: Leanora Cover, MD;  Location: [facility];  Service: Orthopedics;  Laterality: Left;  I & D LEFT RING FINGER   Left arm surg for nuerofibroma with skin grafts     MALONEY DILATION N/A 04/21/2016   Procedure: Venia Minks DILATION;  Surgeon: Daneil Dolin, MD;  Location: [facility];  Service: Endoscopy;  Laterality: N/A;   SKIN CANCER EXCISION     Nose    Her Family History Is Significant For: Family  History  Problem Relation Age of Onset   Non-Hodgkin's lymphoma Sister    Lung cancer Brother    Colon cancer Neg Hx     Her Social History Is Significant For: Social History   Socioeconomic History   Marital status: Divorced    Spouse name: Not on file   Number of children: Not on file   Years of education: Not on file   Highest education level: Not on file  Occupational History   Not on file  Tobacco Use   Smoking status: Never   Smokeless tobacco: Never  Vaping Use   Vaping Use: Never used  Substance and Sexual Activity   Alcohol use: No   Drug use: No   Sexual activity: Not Currently    Comment: hyst  Other Topics Concern   Not on file  Social History Narrative   Right Handed   1 can of Soda per Day.   Social Determinants of Health   Financial Resource Strain: Not on file  Food Insecurity: Not on file  Transportation Needs: Not on file  Physical Activity: Not on file  Stress: Not on file  Social Connections: Not on file    Her Allergies Are:  Allergies  Allergen Reactions   Levaquin [Levofloxacin] Nausea And Vomiting    Abdominal pain, N/V   Ciprofloxacin Nausea And Vomiting   Keflex [Cephalexin] Nausea Only   Sulfa Antibiotics Rash  :   Her Current Medications Are:  Outpatient Encounter Medications as of 05/27/2022  Medication Sig   Biotin 10000 MCG TABS Take 10,000 mcg by mouth daily.   meloxicam (MOBIC) 15 MG tablet Take 15 mg by mouth daily.   methimazole (TAPAZOLE) 10 MG tablet Take 10 mg by mouth 2 (two) times daily.   Multiple Minerals-Vitamins (CALCIUM-MAGNESIUM-ZINC-D3 PO) Take by mouth.   oxyCODONE-acetaminophen (PERCOCET/ROXICET) 5-325 MG tablet Take 1 tablet by mouth every 8 (eight) hours as needed.   pantoprazole (PROTONIX) 40 MG tablet TAKE 1 TABLET(40 MG) BY MOUTH TWICE DAILY   ampicillin (PRINCIPEN) 500 MG capsule Take 1 capsule (500 mg total) by mouth 3 (three) times daily. (Patient not taking: Reported on 05/05/2022)   conjugated  estrogens (PREMARIN) vaginal cream Use pea sized amount at introitus   ondansetron (ZOFRAN) 4 MG tablet Take 1 tablet (4 mg total) by mouth every 6 (six) hours as needed for nausea or vomiting. (Patient not taking: Reported on 05/27/2022)   oxybutynin (DITROPAN) 5 MG tablet Take 5 mg by mouth daily.   Turmeric (QC TUMERIC COMPLEX PO) Take 1 tablet by mouth daily. (Patient not taking: Reported on 05/27/2022)   Vit B6-Vit B12-Omega 3 Acids (VITAMIN B PLUS+ PO) Take 1,200 mg by mouth daily.  (Patient not taking: Reported on 05/27/2022)   Facility-Administered Encounter Medications as of 05/27/2022  Medication   lidocaine hcl (ophth) (AKTEN) 3.5 % ophthalmic gel 1 application  :   Review of Systems:  Out of a complete 14 point review of systems, all  are reviewed and negative with the exception of these symptoms as listed below:   Review of Systems  Neurological:        Pt states that she is having trouble walking a couple years ago. Pt is here for her Gait and her Memory. Pt states that she feels pressure behind her eyes.     Objective:  Neurological Exam  Physical Exam Physical Examination:   Vitals:   05/27/22 0910  BP: (!) 94/53  Pulse: (!) 46    General Examination: The patient is a very pleasant 76 y.o. female in no acute distress. She appears frail and deconditioned, underweight.    HEENT: Normocephalic, atraumatic, pupils are equal, round and reactive to light, tracking is well-preserved, no obvious facial masking, no nuchal rigidity.  No carotid bruits.  Airway examination reveals moderate mouth dryness, tongue protrudes centrally and palate elevates symmetrically, no dysarthria, hypophonia or voice tremor.  Hearing grossly intact.    Chest: Clear to auscultation without wheezing, rhonchi or crackles noted.  Heart: S1+S2+0, regular and normal without murmurs, rubs or gallops noted.   Abdomen: Soft, non-tender and non-distended.  Extremities: There is no pitting edema in the  distal lower extremities bilaterally.   Skin: Warm and dry without trophic changes noted.   Musculoskeletal: exam reveals prominent arthritic changes in both hands.    Neurologically:  Mental status: The patient is awake, alert and pays attention but is unable to provide details of her history, details are provided by her son.  She does need some redirection.    Mood is normal and affect is normal.  Cranial nerves II - XII are as described above under HEENT exam.  Motor exam: Thin bulk, normal strength for age, normal tone, no resting or action tremor, no postural tremor.    Fine motor skills and coordination: Globally mildly impaired.  No lateralization.    Cerebellar testing: No dysmetria or intention tremor. There is no truncal or gait ataxia.  Sensory exam: intact to light touch in the upper and lower extremities.  Gait, station and balance: She stands with mild difficulty and pushes herself up.  She requires no assistance.  She brought a single-point cane but did not walk a few steps without the cane, she walks slowly and cautiously, no shuffling, preserved arm swing.  Mild upper back curvature noted.   Assessment and plan:  In summary, Robin Preston is a very pleasant 76 y.o.-year old female with an underlying medical history of acid reflux, osteoarthritis, cervical disc disease, vertigo, anxiety, chronic pain, on chronic narcotic pain medication, headaches, OAB, low BMI, thyroid dysfunction, and dizziness, who presents for evaluation of her gait disorder of approximately 2 years duration, intermittent confusion, memory concerns, feeling off balance more recently, recent fall.  I do not see any evidence of parkinsonism.  The patient and her son are reassured in that regard, symptoms are not telltale for visual vertigo.  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  We talked about lifestyle modification and supportive treatments primarily today.  She does not need any new medication from my end of things at this time.  I would like to proceed with a brain MRI with and without contrast to rule out a  structural cause of her symptoms.  She is advised to stay better hydrated with water, and talk to you about medication modification, particularly coming off the diazepam and oxycodone altogether.  She is advised to limit her caffeine intake, she is advised to use her cane or a walker at all times for gait safety.  She is advised to no longer drive.  I do not believe she is safe to drive and her son agrees.  She does not have any obvious focal neurological findings.  Blood work recently showed a low normal vitamin B12, we will do additional blood work and repeat her B12 level.  We talked about the importance of nutrition and good hydration.  She is advised to add Ensure back into her daily regimen.  We will keep her posted as to her MRI and blood test results.  We will also proceed with an EEG due to intermittent confusion, likely in the context of a recent UTI.  We will follow-up in this clinic accordingly but she is advised to make a follow-up appointment with your office as well.  This was an extended visit of over 1 hour with extensive chart review involved as well as counseling and coordination of care.   Thank you very much for allowing me to participate in the care of this nice patient. If I can be of any further assistance to you please do not hesitate to call me at [phone].  Sincerely,   Star Age, MD, PhD

## 2022-05-27 NOTE — Patient Instructions (Signed)
I believe you have a multifactorial gait disorder, meaning, that it is Robin Preston is due to a combination of factors. These factors include: normal aging, degenerative arthritis of your neck, medication side effects including oxybutynin, oxycodone, and diazepam, dehydration, recent UTI.  Please remember to stand up slowly and get your bearings first turn slowly, no bending down to pick anything, no heavy lifting, be extra careful at night and first thing in the morning. Also, be careful in the Bathroom and the kitchen.   Remember to drink plenty of fluid, eat healthy meals and do not skip any meals. Try to eat protein with a every meal and eat a healthy snack such as fruit or nuts or yogurt in between meals. Try to keep a regular sleep-wake schedule and try to exercise daily, particularly in the form of walking, 20-30 minutes a day, if you can. You should consider using a cane or walker.   As far as your medications are concerned, I would like to suggest no new medications.  I would like to discuss with your primary care the possibility of coming off the oxycodone and the diazepam completely.  As far as diagnostic testing: I suggest we proceed with a brain scan, called MRI. We will call you with the test results. We will have to schedule you for this on a separate date. This test requires authorization from your insurance, and we will take care of the insurance process.  We will do an EEG (brainwave test), which we will schedule. We will call you with the results.  We will check blood work today and call you with the test results.

## 2022-05-28 ENCOUNTER — Telehealth: Payer: Self-pay | Admitting: *Deleted

## 2022-05-28 NOTE — Telephone Encounter (Signed)
Spoke to patient wants me to call Annie Main (son) with lab work results .Called Son (checked DPR) VM full will call back tomorrow

## 2022-05-28 NOTE — Telephone Encounter (Signed)
-----   Message from Star Age, MD sent at 05/28/2022  7:43 AM EST ----- Not all blood test results are back but, as suspected, her B12 level was low.  Please advise patient or talk to her son about this and I recommend that she make an appointment with her primary care office to start vitamin B12 injections, her vitamin D was also on the lower end of normal, it may not be a bad idea to start a multivitamin with vitamin D in it or vitamin D separately, she can also talk to her primary care about this and get rechecked in about 3 months for vitamin D and B12 after starting injections.  We will update with other blood test results if they come back abnormal.

## 2022-05-29 ENCOUNTER — Other Ambulatory Visit: Payer: PPO | Admitting: *Deleted

## 2022-06-01 LAB — B12 AND FOLATE PANEL
Folate: 15.5 ng/mL (ref >3.0)
Vitamin B-12: 194 pg/mL — ABNORMAL LOW (ref 232–1245)

## 2022-06-01 LAB — COMPREHENSIVE METABOLIC PANEL
ALT: 16 IU/L (ref 0–32)
AST: 20 IU/L (ref 0–40)
Albumin/Globulin Ratio: 2.2 (ref 1.2–2.2)
Albumin: 5 g/dL — ABNORMAL HIGH (ref 3.8–4.8)
Alkaline Phosphatase: 70 IU/L (ref 44–121)
BUN/Creatinine Ratio: 14 (ref 12–28)
BUN: 13 mg/dL (ref 8–27)
Bilirubin Total: 0.4 mg/dL (ref 0.0–1.2)
CO2: 21 mmol/L (ref 20–29)
Calcium: 10.7 mg/dL — ABNORMAL HIGH (ref 8.7–10.3)
Chloride: 99 mmol/L (ref 96–106)
Creatinine, Ser: 0.93 mg/dL (ref 0.57–1.00)
Globulin, Total: 2.3 g/dL (ref 1.5–4.5)
Glucose: 87 mg/dL (ref 70–99)
Potassium: 4.4 mmol/L (ref 3.5–5.2)
Sodium: 136 mmol/L (ref 134–144)
Total Protein: 7.3 g/dL (ref 6.0–8.5)
eGFR: 63 mL/min/{1.73_m2} (ref >59)

## 2022-06-01 LAB — C-REACTIVE PROTEIN: CRP: <1 mg/L (ref 0–10)

## 2022-06-01 LAB — HGB A1C W/O EAG: Hgb A1c MFr Bld: 5.6 % (ref 4.8–5.6)

## 2022-06-01 LAB — VITAMIN B6: Vitamin B6: 12.2 ug/L (ref 3.4–65.2)

## 2022-06-01 LAB — TSH: TSH: 4.06 u[IU]/mL (ref 0.450–4.500)

## 2022-06-01 LAB — SEDIMENTATION RATE: Sed Rate: 2 mm/hr (ref 0–40)

## 2022-06-01 LAB — VITAMIN B1: Thiamine: 110.8 nmol/L (ref 66.5–200.0)

## 2022-06-01 LAB — RHEUMATOID FACTOR: Rheumatoid fact SerPl-aCnc: <10 IU/mL (ref <14.0)

## 2022-06-01 LAB — ANA W/REFLEX: Anti Nuclear Antibody (ANA): NEGATIVE

## 2022-06-01 LAB — VITAMIN D 25 HYDROXY (VIT D DEFICIENCY, FRACTURES): Vit D, 25-Hydroxy: 38 ng/mL (ref 30.0–100.0)

## 2022-06-01 LAB — RPR: RPR Ser Ql: NONREACTIVE

## 2022-06-01 LAB — AMMONIA: Ammonia: 18 ug/dL — ABNORMAL LOW (ref 31–169)

## 2022-06-02 ENCOUNTER — Telehealth: Payer: Self-pay

## 2022-06-02 NOTE — Telephone Encounter (Signed)
Called patient no answer . Called back busy signal will try back later

## 2022-06-02 NOTE — Telephone Encounter (Signed)
Error

## 2022-06-03 DIAGNOSIS — T50905A Adverse effect of unspecified drugs, medicaments and biological substances, initial encounter: Secondary | ICD-10-CM | POA: Diagnosis not present

## 2022-06-03 DIAGNOSIS — G64 Other disorders of peripheral nervous system: Secondary | ICD-10-CM | POA: Diagnosis not present

## 2022-06-03 DIAGNOSIS — G629 Polyneuropathy, unspecified: Secondary | ICD-10-CM | POA: Diagnosis not present

## 2022-06-05 NOTE — Telephone Encounter (Signed)
Spoke to patient and son gave lab result .  (Checked DPR) Informed both patient and son of Dr Tori Milks recommendation. Son states will call PCP today  and make appointment . Gave son PCP #  Pt and son expressed understanding and thanked me for calling. Sent lab results to PCP

## 2022-06-06 DIAGNOSIS — D518 Other vitamin B12 deficiency anemias: Secondary | ICD-10-CM | POA: Diagnosis not present

## 2022-06-11 ENCOUNTER — Telehealth: Payer: Self-pay | Admitting: *Deleted

## 2022-06-11 ENCOUNTER — Ambulatory Visit
Admission: RE | Admit: 2022-06-11 | Discharge: 2022-06-11 | Disposition: A | Discharge status: Home / Self Care | Payer: PPO | Source: Ambulatory Visit | Attending: Neurology | Admitting: Neurology

## 2022-06-11 DIAGNOSIS — R410 Disorientation, unspecified: Secondary | ICD-10-CM

## 2022-06-11 DIAGNOSIS — R636 Underweight: Secondary | ICD-10-CM

## 2022-06-11 DIAGNOSIS — R2689 Other abnormalities of gait and mobility: Secondary | ICD-10-CM

## 2022-06-11 DIAGNOSIS — R413 Other amnesia: Secondary | ICD-10-CM

## 2022-06-11 DIAGNOSIS — Z79899 Other long term (current) drug therapy: Secondary | ICD-10-CM

## 2022-06-11 MED ORDER — GADOPICLENOL 0.5 MMOL/ML IV SOLN
5.0000 mL | Freq: Once | INTRAVENOUS | Status: AC | PRN
  Administered 2022-06-11: 5 mL via INTRAVENOUS

## 2022-06-11 NOTE — Telephone Encounter (Signed)
-----   Message from Star Age, MD sent at 06/11/2022  4:46 PM EST ----- Please call patient and advise her that her recent brain MRI did not show any acute findings, mostly age-appropriate findings were seen, evidence of chronic sinus inflammation or sinusitis was seen.  She can follow-up with primary care at this juncture.  If she has symptoms of nasal congestion and sinus pressure, I recommend that she follow-up with primary care and discuss a possible referral to ENT next.

## 2022-06-11 NOTE — Telephone Encounter (Signed)
Spoke to son Annie Main (checked DPR ) gave  Pt MRI Brain results . Gave son Dr Tori Milks recommendation .  Son expressed understanding and thanked me for calling

## 2022-06-24 ENCOUNTER — Telehealth: Payer: Self-pay | Admitting: Neurology

## 2022-06-24 NOTE — Telephone Encounter (Signed)
Pt no showed EEG on 05/29/22. We have made multiple attempts to reschedule and cannot reach pt.

## 2022-06-25 DIAGNOSIS — N390 Urinary tract infection, site not specified: Secondary | ICD-10-CM | POA: Diagnosis not present

## 2022-06-25 DIAGNOSIS — R946 Abnormal results of thyroid function studies: Secondary | ICD-10-CM | POA: Diagnosis not present

## 2022-06-25 DIAGNOSIS — Z681 Body mass index (BMI) 19 or less, adult: Secondary | ICD-10-CM | POA: Diagnosis not present

## 2022-07-16 DIAGNOSIS — N390 Urinary tract infection, site not specified: Secondary | ICD-10-CM | POA: Diagnosis not present

## 2022-08-14 DIAGNOSIS — N390 Urinary tract infection, site not specified: Secondary | ICD-10-CM | POA: Diagnosis not present

## 2022-08-18 DIAGNOSIS — H04123 Dry eye syndrome of bilateral lacrimal glands: Secondary | ICD-10-CM | POA: Diagnosis not present

## 2022-08-29 DIAGNOSIS — G629 Polyneuropathy, unspecified: Secondary | ICD-10-CM | POA: Diagnosis not present

## 2022-08-29 DIAGNOSIS — L4051 Distal interphalangeal psoriatic arthropathy: Secondary | ICD-10-CM | POA: Diagnosis not present

## 2022-08-29 DIAGNOSIS — M1991 Primary osteoarthritis, unspecified site: Secondary | ICD-10-CM | POA: Diagnosis not present

## 2022-08-29 DIAGNOSIS — Z681 Body mass index (BMI) 19 or less, adult: Secondary | ICD-10-CM | POA: Diagnosis not present

## 2022-08-29 DIAGNOSIS — N390 Urinary tract infection, site not specified: Secondary | ICD-10-CM | POA: Diagnosis not present

## 2022-09-05 DIAGNOSIS — N390 Urinary tract infection, site not specified: Secondary | ICD-10-CM | POA: Diagnosis not present

## 2022-09-08 ENCOUNTER — Ambulatory Visit: Payer: PPO | Admitting: Internal Medicine

## 2022-09-23 DIAGNOSIS — M810 Age-related osteoporosis without current pathological fracture: Secondary | ICD-10-CM | POA: Diagnosis not present

## 2022-09-23 DIAGNOSIS — M2550 Pain in unspecified joint: Secondary | ICD-10-CM | POA: Diagnosis not present

## 2022-09-23 DIAGNOSIS — K219 Gastro-esophageal reflux disease without esophagitis: Secondary | ICD-10-CM | POA: Diagnosis not present

## 2022-09-23 DIAGNOSIS — Z681 Body mass index (BMI) 19 or less, adult: Secondary | ICD-10-CM | POA: Diagnosis not present

## 2022-09-23 DIAGNOSIS — L4051 Distal interphalangeal psoriatic arthropathy: Secondary | ICD-10-CM | POA: Diagnosis not present

## 2022-09-23 DIAGNOSIS — G64 Other disorders of peripheral nervous system: Secondary | ICD-10-CM | POA: Diagnosis not present

## 2022-09-23 DIAGNOSIS — Z0001 Encounter for general adult medical examination with abnormal findings: Secondary | ICD-10-CM | POA: Diagnosis not present

## 2022-09-23 DIAGNOSIS — Z1331 Encounter for screening for depression: Secondary | ICD-10-CM | POA: Diagnosis not present

## 2022-09-23 DIAGNOSIS — G629 Polyneuropathy, unspecified: Secondary | ICD-10-CM | POA: Diagnosis not present

## 2022-09-23 DIAGNOSIS — M5030 Other cervical disc degeneration, unspecified cervical region: Secondary | ICD-10-CM | POA: Diagnosis not present

## 2022-09-23 DIAGNOSIS — G9332 Myalgic encephalomyelitis/chronic fatigue syndrome: Secondary | ICD-10-CM | POA: Diagnosis not present

## 2022-09-23 DIAGNOSIS — N390 Urinary tract infection, site not specified: Secondary | ICD-10-CM | POA: Diagnosis not present

## 2022-09-23 DIAGNOSIS — M1991 Primary osteoarthritis, unspecified site: Secondary | ICD-10-CM | POA: Diagnosis not present

## 2022-09-23 DIAGNOSIS — D518 Other vitamin B12 deficiency anemias: Secondary | ICD-10-CM | POA: Diagnosis not present

## 2022-09-23 DIAGNOSIS — E559 Vitamin D deficiency, unspecified: Secondary | ICD-10-CM | POA: Diagnosis not present

## 2023-01-02 DIAGNOSIS — G629 Polyneuropathy, unspecified: Secondary | ICD-10-CM | POA: Diagnosis not present

## 2023-01-02 DIAGNOSIS — J329 Chronic sinusitis, unspecified: Secondary | ICD-10-CM | POA: Diagnosis not present

## 2023-01-02 DIAGNOSIS — H8110 Benign paroxysmal vertigo, unspecified ear: Secondary | ICD-10-CM | POA: Diagnosis not present

## 2023-01-02 DIAGNOSIS — K219 Gastro-esophageal reflux disease without esophagitis: Secondary | ICD-10-CM | POA: Diagnosis not present

## 2023-01-02 DIAGNOSIS — M1991 Primary osteoarthritis, unspecified site: Secondary | ICD-10-CM | POA: Diagnosis not present

## 2023-01-02 DIAGNOSIS — Z681 Body mass index (BMI) 19 or less, adult: Secondary | ICD-10-CM | POA: Diagnosis not present

## 2023-02-16 DIAGNOSIS — H6121 Impacted cerumen, right ear: Secondary | ICD-10-CM | POA: Diagnosis not present

## 2023-02-16 DIAGNOSIS — R42 Dizziness and giddiness: Secondary | ICD-10-CM | POA: Diagnosis not present

## 2023-02-16 DIAGNOSIS — J329 Chronic sinusitis, unspecified: Secondary | ICD-10-CM | POA: Diagnosis not present

## 2023-02-16 DIAGNOSIS — G629 Polyneuropathy, unspecified: Secondary | ICD-10-CM | POA: Diagnosis not present

## 2023-02-16 DIAGNOSIS — H8110 Benign paroxysmal vertigo, unspecified ear: Secondary | ICD-10-CM | POA: Diagnosis not present

## 2023-02-16 DIAGNOSIS — Z681 Body mass index (BMI) 19 or less, adult: Secondary | ICD-10-CM | POA: Diagnosis not present

## 2023-02-25 DIAGNOSIS — M1991 Primary osteoarthritis, unspecified site: Secondary | ICD-10-CM | POA: Diagnosis not present

## 2023-02-25 DIAGNOSIS — Z85828 Personal history of other malignant neoplasm of skin: Secondary | ICD-10-CM | POA: Diagnosis not present

## 2023-02-25 DIAGNOSIS — Z602 Problems related to living alone: Secondary | ICD-10-CM | POA: Diagnosis not present

## 2023-02-25 DIAGNOSIS — N3281 Overactive bladder: Secondary | ICD-10-CM | POA: Diagnosis not present

## 2023-02-25 DIAGNOSIS — G629 Polyneuropathy, unspecified: Secondary | ICD-10-CM | POA: Diagnosis not present

## 2023-02-25 DIAGNOSIS — M810 Age-related osteoporosis without current pathological fracture: Secondary | ICD-10-CM | POA: Diagnosis not present

## 2023-02-25 DIAGNOSIS — N390 Urinary tract infection, site not specified: Secondary | ICD-10-CM | POA: Diagnosis not present

## 2023-02-25 DIAGNOSIS — H8110 Benign paroxysmal vertigo, unspecified ear: Secondary | ICD-10-CM | POA: Diagnosis not present

## 2023-02-25 DIAGNOSIS — Z9181 History of falling: Secondary | ICD-10-CM | POA: Diagnosis not present

## 2023-02-25 DIAGNOSIS — L4051 Distal interphalangeal psoriatic arthropathy: Secondary | ICD-10-CM | POA: Diagnosis not present

## 2023-02-25 DIAGNOSIS — Z792 Long term (current) use of antibiotics: Secondary | ICD-10-CM | POA: Diagnosis not present

## 2023-02-25 DIAGNOSIS — E559 Vitamin D deficiency, unspecified: Secondary | ICD-10-CM | POA: Diagnosis not present

## 2023-02-25 DIAGNOSIS — F0394 Unspecified dementia, unspecified severity, with anxiety: Secondary | ICD-10-CM | POA: Diagnosis not present

## 2023-02-25 DIAGNOSIS — K219 Gastro-esophageal reflux disease without esophagitis: Secondary | ICD-10-CM | POA: Diagnosis not present

## 2023-02-25 DIAGNOSIS — R4701 Aphasia: Secondary | ICD-10-CM | POA: Diagnosis not present

## 2023-02-25 DIAGNOSIS — L309 Dermatitis, unspecified: Secondary | ICD-10-CM | POA: Diagnosis not present

## 2023-02-25 DIAGNOSIS — M5030 Other cervical disc degeneration, unspecified cervical region: Secondary | ICD-10-CM | POA: Diagnosis not present

## 2023-02-25 DIAGNOSIS — J321 Chronic frontal sinusitis: Secondary | ICD-10-CM | POA: Diagnosis not present

## 2023-02-25 DIAGNOSIS — G8929 Other chronic pain: Secondary | ICD-10-CM | POA: Diagnosis not present

## 2023-02-25 DIAGNOSIS — Z90710 Acquired absence of both cervix and uterus: Secondary | ICD-10-CM | POA: Diagnosis not present

## 2023-02-25 DIAGNOSIS — M353 Polymyalgia rheumatica: Secondary | ICD-10-CM | POA: Diagnosis not present

## 2023-02-25 DIAGNOSIS — J320 Chronic maxillary sinusitis: Secondary | ICD-10-CM | POA: Diagnosis not present

## 2023-02-25 DIAGNOSIS — M47812 Spondylosis without myelopathy or radiculopathy, cervical region: Secondary | ICD-10-CM | POA: Diagnosis not present

## 2023-03-10 DIAGNOSIS — M1991 Primary osteoarthritis, unspecified site: Secondary | ICD-10-CM | POA: Diagnosis not present

## 2023-03-10 DIAGNOSIS — J321 Chronic frontal sinusitis: Secondary | ICD-10-CM | POA: Diagnosis not present

## 2023-03-10 DIAGNOSIS — M47812 Spondylosis without myelopathy or radiculopathy, cervical region: Secondary | ICD-10-CM | POA: Diagnosis not present

## 2023-03-10 DIAGNOSIS — H8110 Benign paroxysmal vertigo, unspecified ear: Secondary | ICD-10-CM | POA: Diagnosis not present

## 2023-04-07 DIAGNOSIS — K219 Gastro-esophageal reflux disease without esophagitis: Secondary | ICD-10-CM | POA: Diagnosis not present

## 2023-04-07 DIAGNOSIS — M810 Age-related osteoporosis without current pathological fracture: Secondary | ICD-10-CM | POA: Diagnosis not present

## 2023-04-07 DIAGNOSIS — J321 Chronic frontal sinusitis: Secondary | ICD-10-CM | POA: Diagnosis not present

## 2023-04-07 DIAGNOSIS — N390 Urinary tract infection, site not specified: Secondary | ICD-10-CM | POA: Diagnosis not present

## 2023-04-22 DIAGNOSIS — R309 Painful micturition, unspecified: Secondary | ICD-10-CM | POA: Diagnosis not present

## 2023-05-11 DIAGNOSIS — M1991 Primary osteoarthritis, unspecified site: Secondary | ICD-10-CM | POA: Diagnosis not present

## 2023-05-11 DIAGNOSIS — N390 Urinary tract infection, site not specified: Secondary | ICD-10-CM | POA: Diagnosis not present

## 2023-05-11 DIAGNOSIS — M47812 Spondylosis without myelopathy or radiculopathy, cervical region: Secondary | ICD-10-CM | POA: Diagnosis not present

## 2023-05-11 DIAGNOSIS — H8110 Benign paroxysmal vertigo, unspecified ear: Secondary | ICD-10-CM | POA: Diagnosis not present

## 2023-05-20 DIAGNOSIS — R350 Frequency of micturition: Secondary | ICD-10-CM | POA: Diagnosis not present

## 2023-05-20 DIAGNOSIS — N390 Urinary tract infection, site not specified: Secondary | ICD-10-CM | POA: Diagnosis not present

## 2023-05-20 DIAGNOSIS — R300 Dysuria: Secondary | ICD-10-CM | POA: Diagnosis not present

## 2023-06-18 DIAGNOSIS — N390 Urinary tract infection, site not specified: Secondary | ICD-10-CM | POA: Diagnosis not present

## 2023-06-29 DIAGNOSIS — N390 Urinary tract infection, site not specified: Secondary | ICD-10-CM | POA: Diagnosis not present

## 2023-06-29 DIAGNOSIS — M2550 Pain in unspecified joint: Secondary | ICD-10-CM | POA: Diagnosis not present

## 2023-06-29 DIAGNOSIS — M810 Age-related osteoporosis without current pathological fracture: Secondary | ICD-10-CM | POA: Diagnosis not present

## 2023-06-29 DIAGNOSIS — G894 Chronic pain syndrome: Secondary | ICD-10-CM | POA: Diagnosis not present

## 2023-06-29 DIAGNOSIS — L4051 Distal interphalangeal psoriatic arthropathy: Secondary | ICD-10-CM | POA: Diagnosis not present

## 2023-06-29 DIAGNOSIS — M47812 Spondylosis without myelopathy or radiculopathy, cervical region: Secondary | ICD-10-CM | POA: Diagnosis not present

## 2023-06-29 DIAGNOSIS — M1991 Primary osteoarthritis, unspecified site: Secondary | ICD-10-CM | POA: Diagnosis not present

## 2023-06-29 DIAGNOSIS — Z681 Body mass index (BMI) 19 or less, adult: Secondary | ICD-10-CM | POA: Diagnosis not present

## 2023-06-29 DIAGNOSIS — E0590 Thyrotoxicosis, unspecified without thyrotoxic crisis or storm: Secondary | ICD-10-CM | POA: Diagnosis not present

## 2023-08-11 DIAGNOSIS — J329 Chronic sinusitis, unspecified: Secondary | ICD-10-CM | POA: Diagnosis not present

## 2023-08-11 DIAGNOSIS — M47812 Spondylosis without myelopathy or radiculopathy, cervical region: Secondary | ICD-10-CM | POA: Diagnosis not present

## 2023-08-11 DIAGNOSIS — G629 Polyneuropathy, unspecified: Secondary | ICD-10-CM | POA: Diagnosis not present

## 2023-08-11 DIAGNOSIS — F0390 Unspecified dementia without behavioral disturbance: Secondary | ICD-10-CM | POA: Diagnosis not present

## 2023-08-11 DIAGNOSIS — Z681 Body mass index (BMI) 19 or less, adult: Secondary | ICD-10-CM | POA: Diagnosis not present

## 2024-01-14 ENCOUNTER — Ambulatory Visit: Admitting: Family Medicine

## 2024-02-25 ENCOUNTER — Ambulatory Visit (INDEPENDENT_AMBULATORY_CARE_PROVIDER_SITE_OTHER): Admitting: Family Medicine

## 2024-02-25 ENCOUNTER — Telehealth: Payer: Self-pay | Admitting: Neurology

## 2024-02-25 ENCOUNTER — Encounter: Payer: Self-pay | Admitting: Family Medicine

## 2024-02-25 ENCOUNTER — Encounter: Payer: Self-pay | Admitting: Neurology

## 2024-02-25 VITALS — BP 171/69 | HR 57 | Ht 61.0 in | Wt 88.0 lb

## 2024-02-25 DIAGNOSIS — Z13220 Encounter for screening for lipoid disorders: Secondary | ICD-10-CM

## 2024-02-25 DIAGNOSIS — R269 Unspecified abnormalities of gait and mobility: Secondary | ICD-10-CM

## 2024-02-25 DIAGNOSIS — Z862 Personal history of diseases of the blood and blood-forming organs and certain disorders involving the immune mechanism: Secondary | ICD-10-CM

## 2024-02-25 DIAGNOSIS — E538 Deficiency of other specified B group vitamins: Secondary | ICD-10-CM | POA: Diagnosis not present

## 2024-02-25 DIAGNOSIS — Z8639 Personal history of other endocrine, nutritional and metabolic disease: Secondary | ICD-10-CM

## 2024-02-25 DIAGNOSIS — I10 Essential (primary) hypertension: Secondary | ICD-10-CM

## 2024-02-25 DIAGNOSIS — R479 Unspecified speech disturbances: Secondary | ICD-10-CM | POA: Diagnosis not present

## 2024-02-25 NOTE — Assessment & Plan Note (Signed)
 Referring to neurology. ? PD. MRI for further evaluation. Lab testing as well.

## 2024-02-25 NOTE — Assessment & Plan Note (Signed)
Thyroid testing today.

## 2024-02-25 NOTE — Telephone Encounter (Signed)
 error

## 2024-02-25 NOTE — Assessment & Plan Note (Signed)
 Obtaining MRI.

## 2024-02-25 NOTE — Assessment & Plan Note (Signed)
 Uncontrolled. Obtaining labs. Has history of hypotension. Follow up in 1 month.

## 2024-02-25 NOTE — Progress Notes (Signed)
 Subjective:  Patient ID: Robin Preston, female    DOB: 1945/09/20  Age: 78 y.o. MRN: [id number]  CC:   Chief Complaint  Patient presents with   Establish Care    HPI:  78 year old female with a history of chronic pain (now off opioids), ? Hyperthyroidism (previously on Methimazole), recurrent UTI, hypertension, B12 deficiency, ongoing cognitive decline presents to establish care. She is accompanied by her son today.  Currently she is taking only 2 medications - Keflex  for recurrent UTI and Mobic (presumably for OA and chronic pain).   Patient and son reported ongoing decline. She is having difficulty with speech (getting the correct words out), worsening mobility and gait (son reports a shuffling gait), and cognitive decline. Patient states that symptoms have been worsening for the past 1.5 years. Son has seen steady decline for the past 3-4 years.  Patient states that he is no longer able to drive. She has a caregiver that helps with cooking, cleaning. She is able to bathe, feed herself, and use the restroom.  No reported tremor.   Has previously seen by neurology in 2023. Work up at that time was negative.  Patient Active Problem List   Diagnosis Date Noted   History of thyroid  disorder 02/25/2024   History of anemia 02/25/2024   Hypertension 02/25/2024   Gait disturbance 02/25/2024   Difficulty with speech 02/25/2024   B12 deficiency 02/25/2024   History of UTI 04/05/2021    Social Hx   Social History   Socioeconomic History   Marital status: Divorced    Spouse name: Not on file   Number of children: Not on file   Years of education: Not on file   Highest education level: Not on file  Occupational History   Not on file  Tobacco Use   Smoking status: Never   Smokeless tobacco: Never  Vaping Use   Vaping status: Never Used  Substance and Sexual Activity   Alcohol use: No   Drug use: No   Sexual activity: Not Currently    Comment: hyst  Other Topics Concern   Not  on file  Social History Narrative   Right Handed   1 can of Soda per Day.   Social Drivers of Corporate investment banker Strain: Not on file  Food Insecurity: Not on file  Transportation Needs: Not on file  Physical Activity: Not on file  Stress: Not on file  Social Connections: Not on file    Review of Systems Per HPI  Objective:  BP (!) 171/69   Pulse (!) 57   Ht 5' 1 (1.549 m)   Wt 88 lb (39.9 kg)   SpO2 100%   BMI 16.63 kg/m      02/25/2024   10:14 AM 02/25/2024    9:39 AM 02/25/2024    9:38 AM  BP/Weight  Systolic BP 171 165 173  Diastolic BP 69 72 75  Wt. (Lbs)  88   BMI  16.63 kg/m2     Physical Exam Vitals and nursing note reviewed.  Constitutional:      General: She is not in acute distress. HENT:     Head: Normocephalic and atraumatic.  Cardiovascular:     Rate and Rhythm: Regular rhythm. Bradycardia present.  Pulmonary:     Effort: Pulmonary effort is normal.     Breath sounds: Normal breath sounds. No wheezing or rales.  Neurological:     Mental Status: She is alert.  Comments: Shuffling gait. Bradykinesia. ? Mild rigidity with passive movement of the upper extremities. Finger to nose testing abnormal.     Lab Results  Component Value Date   WBC 4.8 05/05/2022   HGB 11.9 (L) 05/05/2022   HCT 36.8 05/05/2022   PLT 239 05/05/2022   GLUCOSE 87 05/27/2022   ALT 16 05/27/2022   AST 20 05/27/2022   NA 136 05/27/2022   K 4.4 05/27/2022   CL 99 05/27/2022   CREATININE 0.93 05/27/2022   BUN 13 05/27/2022   CO2 21 05/27/2022   TSH 4.060 05/27/2022   HGBA1C 5.6 05/27/2022     Assessment & Plan:  Gait disturbance Assessment & Plan: Referring to neurology. ? PD. MRI for further evaluation. Lab testing as well.   Orders: -     MR BRAIN W WO CONTRAST -     Ambulatory referral to Neurology  B12 deficiency -     B12 and Folate Panel -     Methylmalonic acid, serum  History of anemia -     CBC  Hypertension, unspecified  type Assessment & Plan: Uncontrolled. Obtaining labs. Has history of hypotension. Follow up in 1 month.  Orders: -     CMP14+EGFR  Screening, lipid -     Lipid panel  History of thyroid  disorder Assessment & Plan: Thyroid  testing today.  Orders: -     TSH + free T4 -     T3, free  Difficulty with speech Assessment & Plan: Obtaining MRI.  Orders: -     MR BRAIN W WO CONTRAST -     Ambulatory referral to Neurology    Follow-up:  1 month  De Libman Bluford DO [facility]

## 2024-02-25 NOTE — Patient Instructions (Signed)
 Labs today.   Arranging MRI.  Discussing with Neurology.  Follow up in 1 month.

## 2024-02-28 ENCOUNTER — Ambulatory Visit: Payer: Self-pay | Admitting: Family Medicine

## 2024-02-29 LAB — CBC
Hematocrit: 36 % (ref 34.0–46.6)
Hemoglobin: 11.5 g/dL (ref 11.1–15.9)
MCH: 29.3 pg (ref 26.6–33.0)
MCHC: 31.9 g/dL (ref 31.5–35.7)
MCV: 92 fL (ref 79–97)
Platelets: 222 x10E3/uL (ref 150–450)
RBC: 3.92 x10E6/uL (ref 3.77–5.28)
RDW: 12.3 % (ref 11.7–15.4)
WBC: 4.6 x10E3/uL (ref 3.4–10.8)

## 2024-02-29 LAB — CMP14+EGFR
ALT: 27 IU/L (ref 0–32)
AST: 32 IU/L (ref 0–40)
Albumin: 4.5 g/dL (ref 3.8–4.8)
Alkaline Phosphatase: 75 IU/L (ref 44–121)
BUN/Creatinine Ratio: 16 (ref 12–28)
BUN: 15 mg/dL (ref 8–27)
Bilirubin Total: 0.3 mg/dL (ref 0.0–1.2)
CO2: 22 mmol/L (ref 20–29)
Calcium: 10.1 mg/dL (ref 8.7–10.3)
Chloride: 98 mmol/L (ref 96–106)
Creatinine, Ser: 0.91 mg/dL (ref 0.57–1.00)
Globulin, Total: 2.2 g/dL (ref 1.5–4.5)
Glucose: 84 mg/dL (ref 70–99)
Potassium: 5.1 mmol/L (ref 3.5–5.2)
Sodium: 134 mmol/L (ref 134–144)
Total Protein: 6.7 g/dL (ref 6.0–8.5)
eGFR: 64 mL/min/1.73 (ref >59)

## 2024-02-29 LAB — LIPID PANEL
Chol/HDL Ratio: 2.4 ratio (ref 0.0–4.4)
Cholesterol, Total: 153 mg/dL (ref 100–199)
HDL: 63 mg/dL (ref >39)
LDL Chol Calc (NIH): 68 mg/dL (ref 0–99)
Triglycerides: 124 mg/dL (ref 0–149)
VLDL Cholesterol Cal: 22 mg/dL (ref 5–40)

## 2024-02-29 LAB — METHYLMALONIC ACID, SERUM: Methylmalonic Acid: 697 nmol/L — ABNORMAL HIGH (ref 0–378)

## 2024-02-29 LAB — B12 AND FOLATE PANEL
Folate: 15.9 ng/mL (ref >3.0)
Vitamin B-12: 222 pg/mL — ABNORMAL LOW (ref 232–1245)

## 2024-02-29 LAB — TSH+FREE T4
Free T4: 1.15 ng/dL (ref 0.82–1.77)
TSH: 1.2 u[IU]/mL (ref 0.450–4.500)

## 2024-02-29 LAB — T3, FREE: T3, Free: 2.7 pg/mL (ref 2.0–4.4)

## 2024-03-01 ENCOUNTER — Other Ambulatory Visit: Payer: Self-pay

## 2024-03-01 ENCOUNTER — Ambulatory Visit (HOSPITAL_COMMUNITY)
Admission: RE | Admit: 2024-03-01 | Discharge: 2024-03-01 | Disposition: A | Discharge status: Home / Self Care | Source: Ambulatory Visit | Attending: Family Medicine | Admitting: Family Medicine

## 2024-03-01 DIAGNOSIS — R4182 Altered mental status, unspecified: Secondary | ICD-10-CM | POA: Diagnosis not present

## 2024-03-01 DIAGNOSIS — R479 Unspecified speech disturbances: Secondary | ICD-10-CM | POA: Diagnosis not present

## 2024-03-01 DIAGNOSIS — E538 Deficiency of other specified B group vitamins: Secondary | ICD-10-CM

## 2024-03-01 DIAGNOSIS — R269 Unspecified abnormalities of gait and mobility: Secondary | ICD-10-CM | POA: Diagnosis not present

## 2024-03-01 MED ORDER — GADOBUTROL 1 MMOL/ML IV SOLN
4.0000 mL | Freq: Once | INTRAVENOUS | Status: AC | PRN
  Administered 2024-03-01: 4 mL via INTRAVENOUS

## 2024-03-03 NOTE — Progress Notes (Deleted)
 Assessment/Plan:    ***  Subjective:   Robin Preston was seen today in the movement disorders clinic for neurologic consultation at the request of Cook, Jayce G, DO.  The consultation is for the evaluation of shuffling gait and to rule out parkinsonism.  Medical records made available to me are reviewed.  Patient previously saw Dr. Buck in 2023 and she noted I do not see any evidence of parkinsonism.  Patient's complaints at that time were gait trouble, memory and speech trouble for 2 years prior.  It was felt that symptoms were due to daily diazepam, oxybutynin and infrequent use of oxycodone .  More recently, the patient did see her primary care physician on September 4, and he noted concerns for parkinsonism.  Referral was for second opinion.   Specific Symptoms:  Tremor: {yes no:314532} Family hx of similar:  {yes no:314532} Voice: *** Sleep: ***  Vivid Dreams:  {yes no:314532}  Acting out dreams:  {yes no:314532} Wet Pillows: {yes no:314532} Postural symptoms:  {yes no:314532}  Falls?  {yes no:314532} Bradykinesia symptoms: {parkinson brady:18041} Loss of smell:  {yes no:314532} Loss of taste:  {yes no:314532} Urinary Incontinence:  {yes no:314532} Difficulty Swallowing:  {yes no:314532} Handwriting, micrographia: {yes no:314532} Trouble with ADL's:  {yes no:314532}  Trouble buttoning clothing: {yes no:314532} Depression:  {yes no:314532} Memory changes:  {yes no:314532} Hallucinations:  {yes no:314532}  visual distortions: {yes no:314532} N/V:  {yes no:314532} Lightheaded:  {yes no:314532}  Syncope: {yes no:314532} Diplopia:  {yes no:314532} Dyskinesia:  {yes no:314532} Prior exposure to reglan/antipsychotics: {yes no:314532}  Neuroimaging of the brain has *** previously been performed.  It *** available for my review today.  MRI brain was completed March 01, 2024.  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  PREVIOUS MEDICATIONS:  {Parkinson's RX:18200}  ALLERGIES:   Allergies  Allergen Reactions   Levaquin [Levofloxacin] Nausea And Vomiting    Abdominal pain, N/V   Ciprofloxacin  Nausea And Vomiting   Keflex  [Cephalexin ] Nausea Only   Sulfa Antibiotics Rash    CURRENT MEDICATIONS:  No outpatient medications have been marked as taking for the 03/07/24 encounter (Appointment) with Felicie Kocher, Asberry RAMAN, DO.     Objective:   VITALS:  There were no vitals filed for this visit.  GEN:  The patient appears stated age and is in NAD. HEENT:  Normocephalic, atraumatic.  The mucous membranes are moist. The superficial temporal arteries are without ropiness or tenderness. CV:  RRR Lungs:  CTAB Neck/HEME:  There are no carotid bruits bilaterally.  Neurological examination:  Orientation: The patient is alert and oriented x3.  Cranial nerves: There is good facial symmetry. Extraocular muscles are intact. The visual fields are full to confrontational testing. The speech is fluent and clear. Soft palate rises symmetrically and there is no tongue deviation. Hearing is intact to conversational tone. Sensation: Sensation is intact to light and pinprick throughout (facial, trunk, extremities). Vibration is intact at the bilateral big toe. There is no extinction with double simultaneous stimulation. There is no sensory dermatomal level identified. Motor: Strength is 5/5 in the bilateral upper and lower extremities.   Shoulder shrug is equal and symmetric.  There is no pronator drift. Deep tendon reflexes: Deep tendon reflexes are 2/4 at the bilateral biceps, triceps, brachioradialis, patella and achilles. Plantar responses are downgoing bilaterally.  Movement examination: Tone: There is ***tone in the bilateral upper extremities.  The tone in the lower extremities is ***.  Abnormal movements: *** Coordination:  There is ***  decremation with RAM's, *** Gait and Station: The patient has *** difficulty arising out of a deep-seated chair  without the use of the hands. The patient's stride length is ***.  The patient has a *** pull test.     I have reviewed and interpreted the following labs independently   Chemistry      Component Value Date/Time   NA 134 02/25/2024 1041   K 5.1 02/25/2024 1041   CL 98 02/25/2024 1041   CO2 22 02/25/2024 1041   BUN 15 02/25/2024 1041   CREATININE 0.91 02/25/2024 1041      Component Value Date/Time   CALCIUM 10.1 02/25/2024 1041   ALKPHOS 75 02/25/2024 1041   AST 32 02/25/2024 1041   ALT 27 02/25/2024 1041   BILITOT 0.3 02/25/2024 1041      Lab Results  Component Value Date   TSH 1.200 02/25/2024   Lab Results  Component Value Date   WBC 4.6 02/25/2024   HGB 11.5 02/25/2024   HCT 36.0 02/25/2024   MCV 92 02/25/2024   PLT 222 02/25/2024     Total time spent on today's visit was ***60 minutes, including both face-to-face time and nonface-to-face time.  Time included that spent on review of records (prior notes available to me/labs/imaging if pertinent), discussing treatment and goals, answering patient's questions and coordinating care.  Cc:  Cook, Jayce G, DO

## 2024-03-07 ENCOUNTER — Ambulatory Visit: Admitting: Neurology

## 2024-03-07 ENCOUNTER — Encounter: Payer: Self-pay | Admitting: Neurology

## 2024-03-07 NOTE — Telephone Encounter (Signed)
-----   Message from Jacqulyn KANDICE Ahle sent at 03/05/2024  7:59 PM EDT ----- No significant change in MRI since 2023. ----- Message ----- From: Interface, Rad Results In Sent: 03/04/2024  10:32 AM EDT To: Jayce G Cook, DO

## 2024-03-07 NOTE — Telephone Encounter (Signed)
 Patient aware of results.

## 2024-03-25 ENCOUNTER — Telehealth: Payer: Self-pay | Admitting: *Deleted

## 2024-03-25 NOTE — Telephone Encounter (Signed)
 Copied from CRM [id number]. Topic: Referral - Request for Referral >> Mar 25, 2024  2:03 PM Fonda T wrote: Did the patient discuss referral with their provider in the last year? Yes (If No - schedule appointment) (If Yes - send message)  Appointment offered? No  Type of order/referral and detailed reason for visit: Medical facility for memory loss, was previously discussed at last appointment.  Preference of office, provider, location: [place] location  If referral order, have you been seen by this specialty before? No (If Yes, this issue or another issue? When? Where?  Can we respond through MyChart? No, son prefers call to [phone].

## 2024-03-28 NOTE — Telephone Encounter (Signed)
 Cook, Jayce G, DO      03/27/24 12:16 PM She just established care with me. What is the referral regarding?

## 2024-03-28 NOTE — Telephone Encounter (Signed)
 Cook, Jayce G, DO      03/28/24  2:44 PM Referral to neurology is still pending.

## 2024-03-29 ENCOUNTER — Ambulatory Visit (INDEPENDENT_AMBULATORY_CARE_PROVIDER_SITE_OTHER): Admitting: Family Medicine

## 2024-03-29 ENCOUNTER — Encounter: Payer: Self-pay | Admitting: Family Medicine

## 2024-03-29 VITALS — BP 149/83 | HR 71 | Ht 60.0 in | Wt 82.0 lb

## 2024-03-29 DIAGNOSIS — E538 Deficiency of other specified B group vitamins: Secondary | ICD-10-CM | POA: Diagnosis not present

## 2024-03-29 DIAGNOSIS — R269 Unspecified abnormalities of gait and mobility: Secondary | ICD-10-CM | POA: Diagnosis not present

## 2024-03-29 DIAGNOSIS — I10 Essential (primary) hypertension: Secondary | ICD-10-CM | POA: Diagnosis not present

## 2024-03-29 NOTE — Progress Notes (Signed)
 Subjective:  Patient ID: Robin Preston, female    DOB: 02-04-1946  Age: 78 y.o. MRN: [id number]  CC:   Chief Complaint  Patient presents with   Care Management    One month follow up     HPI:  78 year old female presents for follow-up.  She is accompanied by her son.  Patient missed her neurology appointment.  I will reach out to neurology today.  BP improved.  Mildly elevated.  Has a history of hypertension so not pursuing rest of treatment at this time.  Labs and recent MRI reviewed.  MRI essentially unremarkable.  Labs notable for vitamin B12 deficiency.  Patient reports that she continues to not feel like her old self.  Has a shuffling gait, worsening mobility, difficulty with speech.  Patient Active Problem List   Diagnosis Date Noted   History of thyroid  disorder 02/25/2024   History of anemia 02/25/2024   Hypertension 02/25/2024   Gait disturbance 02/25/2024   Difficulty with speech 02/25/2024   B12 deficiency 02/25/2024   History of UTI 04/05/2021    Social Hx   Social History   Socioeconomic History   Marital status: Divorced    Spouse name: Not on file   Number of children: Not on file   Years of education: Not on file   Highest education level: Not on file  Occupational History   Not on file  Tobacco Use   Smoking status: Never   Smokeless tobacco: Never  Vaping Use   Vaping status: Never Used  Substance and Sexual Activity   Alcohol use: No   Drug use: No   Sexual activity: Not Currently    Comment: hyst  Other Topics Concern   Not on file  Social History Narrative   Right Handed   1 can of Soda per Day.   Social Drivers of Corporate investment banker Strain: Not on file  Food Insecurity: Not on file  Transportation Needs: Not on file  Physical Activity: Not on file  Stress: Not on file  Social Connections: Not on file    Review of Systems Per HPI  Objective:  BP (!) 149/83   Pulse 71   Ht 5' (1.524 m)   Wt 82 lb (37.2 kg)   SpO2  98%   BMI 16.01 kg/m      03/29/2024    9:53 AM 02/25/2024   10:14 AM 02/25/2024    9:39 AM  BP/Weight  Systolic BP 149 171 165  Diastolic BP 83 69 72  Wt. (Lbs) 82  88  BMI 16.01 kg/m2  16.63 kg/m2    Physical Exam Vitals and nursing note reviewed.  Constitutional:      General: She is not in acute distress.    Appearance: Normal appearance.  HENT:     Head: Normocephalic and atraumatic.  Cardiovascular:     Rate and Rhythm: Normal rate and regular rhythm.  Pulmonary:     Effort: Pulmonary effort is normal.     Breath sounds: Normal breath sounds. No wheezing, rhonchi or rales.  Neurological:     Mental Status: She is alert. Mental status is at baseline.     Lab Results  Component Value Date   WBC 4.6 02/25/2024   HGB 11.5 02/25/2024   HCT 36.0 02/25/2024   PLT 222 02/25/2024   GLUCOSE 84 02/25/2024   CHOL 153 02/25/2024   TRIG 124 02/25/2024   HDL 63 02/25/2024   LDLCALC 68 02/25/2024   ALT  27 02/25/2024   AST 32 02/25/2024   NA 134 02/25/2024   K 5.1 02/25/2024   CL 98 02/25/2024   CREATININE 0.91 02/25/2024   BUN 15 02/25/2024   CO2 22 02/25/2024   TSH 1.200 02/25/2024   HGBA1C 5.6 05/27/2022     Assessment & Plan:  Gait disturbance Assessment & Plan: I spoke with neurology.  I got her an appointment for November.   B12 deficiency Assessment & Plan: Recommended trial of oral supplementation.  Recheck in 3 months.   Hypertension, unspecified type Assessment & Plan: Given advanced age, fair control.  Will continue to monitor closely.     Follow-up: 3 months  Jennett Tarbell Bluford DO [facility]

## 2024-03-29 NOTE — Patient Instructions (Signed)
 B12 2000 mcg daily.  Appt with Dr. Evonnie on 11/14 @ 9:45 am.  Follow up with me in 3 months.

## 2024-03-29 NOTE — Telephone Encounter (Signed)
 Patient has follow up office visit scheduled today with the provider

## 2024-03-29 NOTE — Assessment & Plan Note (Signed)
 Recommended trial of oral supplementation.  Recheck in 3 months.

## 2024-03-29 NOTE — Assessment & Plan Note (Signed)
 Given advanced age, fair control.  Will continue to monitor closely.

## 2024-03-29 NOTE — Assessment & Plan Note (Signed)
 I spoke with neurology.  I got her an appointment for November.

## 2024-05-04 NOTE — Progress Notes (Unsigned)
 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  Discussed that this can lead to some parkinsonism but I don't see any idiopathic Parkinsons Disease.    -pt with dementia and explained to son that patient should not be living alone.  I do believe that patient really needs 24 hour/day care.  - Discussed with patient and son that there really is no good treatment.  Sometimes, speech therapy can help early on, but given the degree of memory change, I am not so sure that this would be particularly beneficial.  The same is probably true for physical therapy in her case.  Subjective:   Robin Preston was seen today in the movement disorders clinic for neurologic consultation at the request of Cook, Jayce G, DO. Son accompanies patient and supplements hx.  The consultation is for the evaluation of shuffling gait and to rule out parkinsonism.  Medical records made available to me are reviewed.  Patient previously saw Dr. Buck in 2023 and she noted I do not see any evidence of parkinsonism.   Patient's complaints at the time she saw [facility] in 2023 were gait trouble, memory and speech trouble for 2 years prior.  It was felt that symptoms were due to daily diazepam, oxybutynin and infrequent use of oxycodone .  More recently, the patient did see her primary care physician on September 4, and he noted concerns for parkinsonism.  Referral was for second opinion.  Patient did have an appointment here in September, but did not show to that appointment, so today is the first day that I am seeing her  Tremor: No.     Fam hx of tremor?  brother with Parkinsons Disease   Tremor inducing meds:  No.  Other Specific Symptoms:  Voice: always been soft but she has trouble putting words together Postural symptoms:  Yes.    Falls?  No. Per pt but had an aid (none now) and aid had reported near fall and she caught her Bradykinesia symptoms: shuffling gait and  slow movements Urinary Incontinence:  No. Difficulty Swallowing:  No. Trouble with ADL's:  No. Memory changes:  Yes.  , memory trouble.  Word finding trouble.  Gets confused on dates.  Might not know age of brother.  Lives alone.  Son started taking care of bills x 1 year because she was having more trouble.  She doesn't really take meds but she was and son was preparing pill box but she doesn't always take.  She doesn't drive and quit x 3+ years ago and she should have quit driving before that.  Son brings food to her.  She has trouble using microwave and the TV.   Hallucinations:  No.  visual distortions: No. Lightheaded:  No.  Syncope: No. Diplopia:  No. Dyskinesia:  No.  MRI brain was done 03/01/24 and demonstrated atrophy and WMD, overall moderate but none really in the basal ganglia.  PREVIOUS MEDICATIONS: none to date  ALLERGIES:   Allergies  Allergen Reactions   Levaquin [Levofloxacin] Nausea And Vomiting    Abdominal pain, N/V   Ciprofloxacin  Nausea And Vomiting   Keflex  [Cephalexin ] Nausea Only   Sulfa Antibiotics Rash    CURRENT MEDICATIONS:  Current Outpatient Medications  Medication Instructions   cephALEXin  (KEFLEX ) 250 mg, Daily    Objective:   PHYSICAL EXAMINATION:    VITALS:   Vitals:   05/06/24 0947  BP: 126/84  Weight: 83 lb 6.4 oz (37.8 kg)  Height: 5'  2 (1.575 m)    GEN:  The patient appears stated age and is in NAD. HEENT:  Normocephalic, atraumatic.  The mucous membranes are moist. The superficial temporal arteries are without ropiness or tenderness. CV:  RRR Lungs:  CTAB Neck/HEME:  There are no carotid bruits bilaterally.  Neurological examination:  Orientation: The patient is alert and oriented to person.  She has some trouble staying focused on the question being asked (some of this may be because of hearing) Cranial nerves: There is good facial symmetry.  There is no significant facial hypomimia.  Extraocular muscles are intact.  She  does not participate well with formal visual field testing.  The speech is clear but there is dysphasia. When given a safety pin, she calls it a stick.  Then she says it says ouch, when it busts your butt.  She eventually comes up with the name of it.  When shown a battery she says it makes music and takes your money. Soft palate rises symmetrically and there is no tongue deviation. Hearing is decreased to conversational tone. Sensation: Sensation is intact to light touch throughout (facial, trunk, extremities). Vibration is intact at the bilateral wrists, but appears it is decreased distally as she states that it is difficult to feel at the knees.  She seems to lose focus when we are doing this task. There is no extinction with double simultaneous stimulation.  Motor: Strength is 5/5 in the bilateral upper and lower extremities.   Shoulder shrug is equal and symmetric.   Deep tendon reflexes: Deep tendon reflexes are 2+/4 at the bilateral biceps, triceps, brachioradialis, patella and achilles. Plantar responses are downgoing bilaterally.  Movement examination: Tone: There is normal tone in the bilateral upper extremities.  The tone in the lower extremities is normal.  Abnormal movements: None Coordination:  There is no decremation with RAM's, with any form of RAMS, including alternating supination and pronation of the forearm, hand opening and closing, finger taps, heel taps and toe taps.  Gait and Station: The patient pushes off to arise.  She is wide-based and holds her cane.  She is just slightly short stepped, but her son states that she is doing very well today and showing off.  She actually is marching in the hall purposefully and singing marching tunes. I have reviewed and interpreted the following labs independently   Chemistry      Component Value Date/Time   NA 134 02/25/2024 1041   K 5.1 02/25/2024 1041   CL 98 02/25/2024 1041   CO2 22 02/25/2024 1041   BUN 15 02/25/2024 1041    CREATININE 0.91 02/25/2024 1041      Component Value Date/Time   CALCIUM 10.1 02/25/2024 1041   ALKPHOS 75 02/25/2024 1041   AST 32 02/25/2024 1041   ALT 27 02/25/2024 1041   BILITOT 0.3 02/25/2024 1041      Lab Results  Component Value Date   TSH 1.200 02/25/2024   Lab Results  Component Value Date   WBC 4.6 02/25/2024   HGB 11.5 02/25/2024   HCT 36.0 02/25/2024   MCV 92 02/25/2024   PLT 222 02/25/2024      Total time spent on today's visit was  60 minutes, including both face-to-face time and nonface-to-face time.  Time included that spent on review of records (prior notes available to me/labs/imaging if pertinent), discussing treatment and goals, answering patient's questions and coordinating care.  Cc:  Cook, Jayce G, DO

## 2024-05-06 ENCOUNTER — Encounter: Payer: Self-pay | Admitting: Neurology

## 2024-05-06 ENCOUNTER — Ambulatory Visit (INDEPENDENT_AMBULATORY_CARE_PROVIDER_SITE_OTHER): Admitting: Neurology

## 2024-05-06 ENCOUNTER — Ambulatory Visit

## 2024-05-06 VITALS — BP 126/84 | Ht 62.0 in | Wt 83.4 lb

## 2024-05-06 DIAGNOSIS — F0280 Dementia in other diseases classified elsewhere without behavioral disturbance: Secondary | ICD-10-CM

## 2024-05-06 DIAGNOSIS — G3101 Pick's disease: Secondary | ICD-10-CM | POA: Diagnosis not present

## 2024-05-06 NOTE — Patient Instructions (Signed)
 Primary progressive aphasia (PPA) is a rare brain condition that mainly affects language skills, such as speaking, understanding, reading, and writing. Unlike other types of dementia, memory, thinking, and behavior usually stay normal at first. PPA gets worse over time, and language problems become more noticeable.[1][9] What causes PPA? PPA happens because of slow damage to the parts of the brain that control language, usually on the left side. This damage is caused by diseases like frontotemporal lobar degeneration or Alzheimer's disease.[1-2][4][6] Types of PPA There are three main types of PPA, each with different symptoms:[1][3][5-6] Non-fluent/agrammatic variant: Speech is slow and hard to get out. Sentences may be short or missing words. Grammar mistakes are common. Semantic variant: People lose the meaning of words. They may have trouble understanding or naming objects, even though speech sounds smooth. Logopenic variant: Trouble finding words and repeating sentences. Speech may sound hesitant, but grammar and word meaning are mostly okay. How is PPA diagnosed? Doctors diagnose PPA by asking questions, doing language tests, and sometimes using brain scans. PPA is diagnosed when language problems are the main issue for at least two years, and other thinking skills are mostly normal.[9] Brain scans may show shrinkage in language areas.[4] Living with PPA There is no cure for PPA, but speech and language therapy can help people communicate better and stay independent longer.[7-8][10-11] Therapists may teach new ways to communicate, like using gestures, writing, or pictures. Family and friends can learn strategies to help conversations go more smoothly.[8] Support groups and counseling may also help with emotional challenges. What to expect PPA gets worse over time, and language problems will increase. Some people may eventually have trouble with memory or other thinking skills, but language will  always be the main problem.[1][9] Each person's experience is different, and care should be tailored to their needs and goals.[10] Tips for families and caregivers Be patient and give extra time for communication. Use simple sentences and repeat information if needed. Encourage other ways to communicate, like writing or drawing. Ask a speech-language therapist for advice and training.[8][11] If you or someone you know is experiencing language problems, talk to a doctor or speech-language therapist for help and support.

## 2024-06-14 ENCOUNTER — Telehealth: Payer: Self-pay | Admitting: Family Medicine

## 2024-06-14 NOTE — Telephone Encounter (Unsigned)
 Copied from CRM #[id number]. Topic: Clinical - Medication Refill >> Jun 14, 2024 12:39 PM Tobias L wrote: Medication: pantoprazole  40mg  - Once daily   Requesitng for Dr. Bluford to takeover prescription and refills. Previously filled by Dr. Bertell  Has the patient contacted their pharmacy? Yes Told to reach out to office.   This is the patient's preferred pharmacy:  [facility] [place], [place] - [address] [place] [address] Phone: [phone] Fax: [phone]  Is this the correct pharmacy for this prescription? Yes If no, delete pharmacy and type the correct one.   Has the prescription been filled recently? No  Is the patient out of the medication? Yes  Has the patient been seen for an appointment in the last year OR does the patient have an upcoming appointment? Yes  Can we respond through MyChart? No  Agent: Please be advised that Rx refills may take up to 3 business days. We ask that you follow-up with your pharmacy.

## 2024-06-30 ENCOUNTER — Ambulatory Visit: Admitting: Family Medicine

## 2024-06-30 ENCOUNTER — Encounter: Payer: Self-pay | Admitting: Family Medicine

## 2024-06-30 ENCOUNTER — Ambulatory Visit

## 2024-06-30 VITALS — BP 140/88 | Ht 62.0 in | Wt 83.0 lb

## 2024-06-30 VITALS — BP 169/84 | HR 44 | Temp 97.5°F | Ht 62.0 in | Wt 83.0 lb

## 2024-06-30 DIAGNOSIS — F0280 Dementia in other diseases classified elsewhere without behavioral disturbance: Secondary | ICD-10-CM | POA: Diagnosis not present

## 2024-06-30 DIAGNOSIS — Z87440 Personal history of urinary (tract) infections: Secondary | ICD-10-CM | POA: Diagnosis not present

## 2024-06-30 DIAGNOSIS — K219 Gastro-esophageal reflux disease without esophagitis: Secondary | ICD-10-CM | POA: Insufficient documentation

## 2024-06-30 DIAGNOSIS — Z780 Asymptomatic menopausal state: Secondary | ICD-10-CM | POA: Diagnosis not present

## 2024-06-30 DIAGNOSIS — I10 Essential (primary) hypertension: Secondary | ICD-10-CM | POA: Diagnosis not present

## 2024-06-30 DIAGNOSIS — Z0000 Encounter for general adult medical examination without abnormal findings: Secondary | ICD-10-CM

## 2024-06-30 DIAGNOSIS — G3101 Pick's disease: Secondary | ICD-10-CM | POA: Diagnosis not present

## 2024-06-30 DIAGNOSIS — Z13820 Encounter for screening for osteoporosis: Secondary | ICD-10-CM

## 2024-06-30 DIAGNOSIS — F0390 Unspecified dementia without behavioral disturbance: Secondary | ICD-10-CM | POA: Insufficient documentation

## 2024-06-30 DIAGNOSIS — E538 Deficiency of other specified B group vitamins: Secondary | ICD-10-CM | POA: Diagnosis not present

## 2024-06-30 MED ORDER — CEPHALEXIN 250 MG PO CAPS: 250.0000 mg | ORAL_CAPSULE | Freq: Every day | ORAL | 3 refills | Status: AC

## 2024-06-30 MED ORDER — AMLODIPINE BESYLATE 5 MG PO TABS: 5.0000 mg | ORAL_TABLET | Freq: Every day | ORAL | 3 refills | Status: AC

## 2024-06-30 MED ORDER — OMEPRAZOLE 20 MG PO CPDR: 20.0000 mg | DELAYED_RELEASE_CAPSULE | Freq: Every day | ORAL | 3 refills | Status: AC

## 2024-06-30 MED ORDER — VITAMIN B-12 1000 MCG PO TABS: 2000.0000 ug | ORAL_TABLET | Freq: Every day | ORAL | 3 refills | Status: AC

## 2024-06-30 NOTE — Assessment & Plan Note (Signed)
 Keflex  refilled.

## 2024-06-30 NOTE — Patient Instructions (Signed)
 Ms. Rask,  Thank you for taking the time for your Medicare Wellness Visit. I appreciate your continued commitment to your health goals. Please review the care plan we discussed, and feel free to reach out if I can assist you further.  Please note that Annual Wellness Visits do not include a physical exam. Some assessments may be limited, especially if the visit was conducted virtually. If needed, we may recommend an in-person follow-up with your provider.  Ongoing Care Seeing your primary care provider every 3 to 6 months helps us  monitor your health and provide consistent, personalized care.   1 year follow up for Medicare well visit: July 06, 2025 at 8:40 am with medicare wellness nurse in office  Recommended Screenings:  Health Maintenance  Topic Date Due   COVID-19 Vaccine (1) Never done   Zoster (Shingles) Vaccine (1 of 2) Never done   Pneumococcal Vaccine for age over 73 (1 of 1 - PCV) Never done   Flu Shot  09/20/2024*   DTaP/Tdap/Td vaccine (3 - Td or Tdap) 10/03/2026   Osteoporosis screening with Bone Density Scan  Completed   Meningitis B Vaccine  Aged Out   Breast Cancer Screening  Discontinued   Hepatitis C Screening  Discontinued  *Topic was postponed. The date shown is not the original due date.       06/30/2024    9:51 AM  Advanced Directives  Does Patient Have a Medical Advance Directive? No  Would patient like information on creating a medical advance directive? No - Patient declined    Vision: Annual vision screenings are recommended for early detection of glaucoma, cataracts, and diabetic retinopathy. These exams can also reveal signs of chronic conditions such as diabetes and high blood pressure.  Dental: Annual dental screenings help detect early signs of oral cancer, gum disease, and other conditions linked to overall health, including heart disease and diabetes.  Please see the attached documents for additional preventive care recommendations.

## 2024-06-30 NOTE — Assessment & Plan Note (Signed)
Uncontrolled.  Starting amlodipine. 

## 2024-06-30 NOTE — Progress Notes (Signed)
 "  Chief Complaint  Patient presents with   Medicare Wellness     Subjective:   Robin Preston is a 79 y.o. female who presents for a Medicare Annual Wellness Visit.  Visit info / Clinical Intake: Medicare Wellness Visit Type:: Subsequent Annual Wellness Visit Persons participating in visit and providing information:: patient & caregiver Medicare Wellness Visit Mode:: In-person (required for WTM) Interpreter Needed?: No Pre-visit prep was completed: yes AWV questionnaire completed by patient prior to visit?: no Living arrangements:: (!) lives alone Patient's Overall Health Status Rating: good Typical amount of pain: none Does pain affect daily life?: no Are you currently prescribed opioids?: no  Dietary Habits and Nutritional Risks How many meals a day?: 3 Eats fruit and vegetables daily?: yes Most meals are obtained by: having others provide food In the last 2 weeks, have you had any of the following?: none Diabetic:: no  Functional Status Activities of Daily Living (to include ambulation/medication): Independent Ambulation: Independent with device- listed below Home Assistive Devices/Equipment: Cane Medication Administration: Needs assistance (comment) Is this a change from baseline?: Pre-admission baseline Home Management (perform basic housework or laundry): Independent Manage your own finances?: (!) no Primary transportation is: family / friends Concerns about vision?: no *vision screening is required for WTM* Concerns about hearing?: no  Fall Screening Falls in the past year?: 0 Number of falls in past year: 0 Was there an injury with Fall?: 0 Fall Risk Category Calculator: 0 Patient Fall Risk Level: Low Fall Risk  Fall Risk Patient at Risk for Falls Due to: Impaired mobility; Impaired balance/gait Fall risk Follow up: Falls evaluation completed; Education provided; Falls prevention discussed  Home and Transportation Safety: All rugs have non-skid backing?:  yes All stairs or steps have railings?: yes Grab bars in the bathtub or shower?: yes Have non-skid surface in bathtub or shower?: yes Good home lighting?: yes Regular seat belt use?: yes Hospital stays in the last year:: no  Cognitive Assessment Difficulty concentrating, remembering, or making decisions? : no Will 6CIT or Mini Cog be Completed: yes What year is it?: 0 points What month is it?: 0 points Give patient an address phrase to remember (5 components): [address] [place] About what time is it?: 0 points Count backwards from 20 to 1: 0 points Say the months of the year in reverse: 0 points Repeat the address phrase from earlier: 0 points 6 CIT Score: 0 points  Advance Directives (For Healthcare) Does Patient Have a Medical Advance Directive?: No Would patient like information on creating a medical advance directive?: No - Patient declined  Reviewed/Updated  Reviewed/Updated: Reviewed All (Medical, Surgical, Family, Medications, Allergies, Care Teams, Patient Goals)    Allergies (verified) Levaquin [levofloxacin], Ciprofloxacin , Keflex  [cephalexin ], and Sulfa antibiotics   Current Medications (verified) Outpatient Encounter Medications as of 06/30/2024  Medication Sig   amLODipine  (NORVASC ) 5 MG tablet Take 1 tablet (5 mg total) by mouth daily.   cephALEXin  (KEFLEX ) 250 MG capsule Take 250 mg by mouth daily.   cyanocobalamin  (VITAMIN B12) 1000 MCG tablet Take 2 tablets (2,000 mcg total) by mouth daily.   omeprazole  (PRILOSEC) 20 MG capsule Take 20 mg by mouth daily.   No facility-administered encounter medications on file as of 06/30/2024.    History: Past Medical History:  Diagnosis Date   Acid reflux    Osteoarthritis    Past Surgical History:  Procedure Laterality Date   ABDOMINAL HYSTERECTOMY     CATARACT EXTRACTION W/PHACO Right 10/04/2012   Procedure:  CATARACT EXTRACTION PHACO AND INTRAOCULAR LENS PLACEMENT (IOC);  Surgeon: Cherene Mania, MD;  Location:  [facility];  Service: Ophthalmology;  Laterality: Right;  CDE:  15.78   COLONOSCOPY W/ POLYPECTOMY  01/2010   internal hemorrhoids;9 mm rectal tubular adenoma. next tcs 2018. INADEQUATE CONSCIOUS SEDATION PER PATIENT   ESOPHAGOGASTRODUODENOSCOPY (EGD) WITH PROPOFOL  N/A 04/21/2016   Dr. Shaaron: Normal esophagus s/p Maloney dilation, unable to exclude occult cervical esophageal web.    FINGER ARTHRODESIS Right 06/06/2014   Procedure: ARTHRODESIS RIGHT THUMB INTRAPHALANGEAL JOINT;  Surgeon: Franky Curia, MD;  Location: 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     MALONEY DILATION N/A 04/21/2016   Procedure: AGAPITO DILATION;  Surgeon: Lamar CHRISTELLA Shaaron, MD;  Location: [facility];  Service: Endoscopy;  Laterality: N/A;   SKIN CANCER EXCISION     Nose   Family History  Problem Relation Age of Onset   Non-Hodgkin's lymphoma Sister    Lung cancer Brother    Parkinson's disease Brother    Colon cancer Neg Hx    Social History   Occupational History   Not on file  Tobacco Use   Smoking status: Never   Smokeless tobacco: Never  Vaping Use   Vaping status: Never Used  Substance and Sexual Activity   Alcohol use: No   Drug use: No   Sexual activity: Not Currently    Comment: hyst   Tobacco Counseling Counseling given: Yes  SDOH Screenings   Food Insecurity: No Food Insecurity (06/30/2024)  Housing: Low Risk (06/30/2024)  Transportation Needs: No Transportation Needs (06/30/2024)  Utilities: Not At Risk (06/30/2024)  Depression (PHQ2-9): High Risk (06/30/2024)  Physical Activity: Inactive (06/30/2024)  Social Connections: Socially Isolated (06/30/2024)  Stress: No Stress  Concern Present (06/30/2024)  Tobacco Use: Low Risk (06/30/2024)  Health Literacy: Adequate Health Literacy (06/30/2024)   See flowsheets for full screening details  Depression Screen PHQ 2 & 9 Depression Scale- Over the past 2 weeks, how often have you been bothered by any of the following problems? Little interest or pleasure in doing things: 2 Feeling down, depressed, or hopeless (PHQ Adolescent also includes...irritable): 0 PHQ-2 Total Score: 2 Trouble falling or staying asleep, or sleeping too much: 0 Feeling tired or having little energy: 2 Poor appetite or overeating (PHQ Adolescent also includes...weight loss): 0 Feeling bad about yourself - or that you are a failure or have let yourself or your family down: 2 Trouble concentrating on things, such as reading the newspaper or watching television (PHQ Adolescent also includes...like school work): 3 Moving or speaking so slowly that other people could have noticed. Or the opposite - being so fidgety or restless that you have been moving around a lot more than usual: 0 Thoughts that you would be better off dead, or of hurting yourself in some way: 2 PHQ-9 Total Score: 11 If you checked off any problems, how difficult have these problems made it for you to do your work, take care of things at home, or get along with other people?: Very difficult  Depression Treatment Depression Interventions/Treatment : Patient refuses Treatment     Goals Addressed  This Visit's Progress    remain as active and independent as possible               Objective:    Today's Vitals   06/30/24 0948  BP: (!) 140/88  Weight: 83 lb (37.6 kg)  Height: 5' 2 (1.575 m)   Body mass index is 15.18 kg/m.  Hearing/Vision screen Vision Screening - Comments:: Patient is not up to date on yearly eye exams.  Declines information today  Immunizations and Health Maintenance Health Maintenance  Topic Date Due   COVID-19 Vaccine (1) Never  done   Zoster Vaccines- Shingrix (1 of 2) Never done   Pneumococcal Vaccine: 50+ Years (1 of 1 - PCV) Never done   Influenza Vaccine  09/20/2024 (Originally 01/22/2024)   DTaP/Tdap/Td (3 - Td or Tdap) 10/03/2026   Bone Density Scan  Completed   Meningococcal B Vaccine  Aged Out   Mammogram  Discontinued   Hepatitis C Screening  Discontinued        Assessment/Plan:  This is a routine wellness examination for Robin Preston.  Patient Care Team: Cook, Jayce G, DO as PCP - General (Family Medicine) Debera Jayson MATSU, MD as PCP - Cardiology (Cardiology)  I have personally reviewed and noted the following in the patients chart:   Medical and social history Use of alcohol, tobacco or illicit drugs  Current medications and supplements including opioid prescriptions. Functional ability and status Nutritional status Physical activity Advanced directives List of other physicians Hospitalizations, surgeries, and ER visits in previous 12 months Vitals Screenings to include cognitive, depression, and falls Referrals and appointments  No orders of the defined types were placed in this encounter.  In addition, I have reviewed and discussed with patient certain preventive protocols, quality metrics, and best practice recommendations. A written personalized care plan for preventive services as well as general preventive health recommendations were provided to patient.   Dennys Traughber, CMA   06/30/2024   Return July 06, 2025 at 8:40 am, for In office Medicare Well Visit w  Wellness Nurse.  After Visit Summary: (In Person-Printed) AVS printed and given to the patient   "

## 2024-06-30 NOTE — Patient Instructions (Addendum)
 B12 2000 mcg daily (small capsule).  Call [phone] to schedule bone density.  Medication as directed.  Follow up in 1 month.

## 2024-06-30 NOTE — Addendum Note (Signed)
 Addended by: BLUFORD JACQULYN MATSU on: 06/30/2024 11:54 AM   Modules accepted: Level of Service

## 2024-06-30 NOTE — Assessment & Plan Note (Signed)
 Stable currently

## 2024-06-30 NOTE — Assessment & Plan Note (Signed)
 Recommended oral B12, small capsule.  2000 mcg daily.

## 2024-06-30 NOTE — Progress Notes (Signed)
 "  Subjective:  Patient ID: Robin Preston, female    DOB: June 20, 1946  Age: 79 y.o. MRN: [id number]  CC:   Chief Complaint  Patient presents with   Follow-up    3 month f/u - gait has not improved per family. C/O headaches and back pain. Neuro ruled out parkinson's per family.    HPI:  79 year old female presents for follow up.  Has primary progressive aphasia.  Still lives in her own but son states that they have someone staying with her.  She has been seen by neurology.  She has had no real change.  Seems to be stable currently.  Blood pressure elevated here today and repeat.  Patient is underweight.  83 pounds here today.  In October she weighed 82 pounds.  Son states that she has a good appetite.  She has been petite all her life.  On prophylactic Keflex  for UTI.  Doing well at this time.  GERD stable on omeprazole .  Has B12 deficiency.  Son states that she did not tolerate a larger B12 tablet.  Will discuss this today.  Patient Active Problem List   Diagnosis Date Noted   Primary progressive aphasia (HCC) 06/30/2024   GERD (gastroesophageal reflux disease) 06/30/2024   History of thyroid  disorder 02/25/2024   History of anemia 02/25/2024   Hypertension 02/25/2024   Gait disturbance 02/25/2024   B12 deficiency 02/25/2024   History of UTI 04/05/2021    Social Hx   Social History   Socioeconomic History   Marital status: Divorced    Spouse name: Not on file   Number of children: Not on file   Years of education: Not on file   Highest education level: Not on file  Occupational History   Not on file  Tobacco Use   Smoking status: Never   Smokeless tobacco: Never  Vaping Use   Vaping status: Never Used  Substance and Sexual Activity   Alcohol use: No   Drug use: No   Sexual activity: Not Currently    Comment: hyst  Other Topics Concern   Not on file  Social History Narrative   Right Handed   1 can of Soda per Day.   Social Drivers of Health   Tobacco Use:  Low Risk (06/30/2024)   Patient History    Smoking Tobacco Use: Never    Smokeless Tobacco Use: Never    Passive Exposure: Not on file  Financial Resource Strain: Not on file  Food Insecurity: No Food Insecurity (06/30/2024)   Epic    Worried About Programme Researcher, Broadcasting/film/video in the Last Year: Never true    Ran Out of Food in the Last Year: Never true  Transportation Needs: No Transportation Needs (06/30/2024)   Epic    Lack of Transportation (Medical): No    Lack of Transportation (Non-Medical): No  Physical Activity: Inactive (06/30/2024)   Exercise Vital Sign    Days of Exercise per Week: 0 days    Minutes of Exercise per Session: 0 min  Stress: No Stress Concern Present (06/30/2024)   [employer] of Occupational Health - Occupational Stress Questionnaire    Feeling of Stress: Not at all  Social Connections: Socially Isolated (06/30/2024)   Social Connection and Isolation Panel    Frequency of Communication with Friends and Family: More than three times a week    Frequency of Social Gatherings with Friends and Family: More than three times a week    Attends Religious Services: Never  Active Member of Clubs or Organizations: No    Attends Banker Meetings: Never    Marital Status: Widowed  Depression (PHQ2-9): High Risk (06/30/2024)   Depression (PHQ2-9)    PHQ-2 Score: 11  Alcohol Screen: Not on file  Housing: Low Risk (06/30/2024)   Epic    Unable to Pay for Housing in the Last Year: No    Number of Times Moved in the Last Year: 0    Homeless in the Last Year: No  Utilities: Not At Risk (06/30/2024)   Epic    Threatened with loss of utilities: No  Health Literacy: Adequate Health Literacy (06/30/2024)   B1300 Health Literacy    Frequency of need for help with medical instructions: Never    Review of Systems Per HPI  Objective:  BP (!) 169/84 (BP Location: Right Arm)   Pulse (!) 44   Temp (!) 97.5 F (36.4 C)   Ht 5' 2 (1.575 m)   Wt 83 lb (37.6 kg)   SpO2 100%    BMI 15.18 kg/m      06/30/2024    9:48 AM 06/30/2024    9:05 AM 06/30/2024    8:24 AM  BP/Weight  Systolic BP 140 169 165  Diastolic BP 88 84 77  Wt. (Lbs) 83  83  BMI 15.18 kg/m2  15.18 kg/m2    Physical Exam Vitals and nursing note reviewed.  Constitutional:      General: She is not in acute distress.    Appearance: Normal appearance.  HENT:     Head: Normocephalic and atraumatic.  Cardiovascular:     Rate and Rhythm: Regular rhythm. Bradycardia present.  Pulmonary:     Effort: Pulmonary effort is normal.     Breath sounds: Normal breath sounds.  Neurological:     Mental Status: She is alert. Mental status is at baseline.  Psychiatric:        Mood and Affect: Mood normal.        Behavior: Behavior normal.     Lab Results  Component Value Date   WBC 4.6 02/25/2024   HGB 11.5 02/25/2024   HCT 36.0 02/25/2024   PLT 222 02/25/2024   GLUCOSE 84 02/25/2024   CHOL 153 02/25/2024   TRIG 124 02/25/2024   HDL 63 02/25/2024   LDLCALC 68 02/25/2024   ALT 27 02/25/2024   AST 32 02/25/2024   NA 134 02/25/2024   K 5.1 02/25/2024   CL 98 02/25/2024   CREATININE 0.91 02/25/2024   BUN 15 02/25/2024   CO2 22 02/25/2024   TSH 1.200 02/25/2024   HGBA1C 5.6 05/27/2022     Assessment & Plan:  Primary progressive aphasia [facility]) Assessment & Plan: Stable currently.   Postmenopausal -     DG Bone Density  Screening for osteoporosis -     DG Bone Density  Hypertension, unspecified type Assessment & Plan: Uncontrolled.  Starting amlodipine .  Orders: -     amLODIPine  Besylate; Take 1 tablet (5 mg total) by mouth daily.  Dispense: 90 tablet; Refill: 3  History of UTI Assessment & Plan: Keflex  refilled.  Orders: -     Cephalexin ; Take 1 capsule (250 mg total) by mouth daily.  Dispense: 90 capsule; Refill: 3  B12 deficiency Assessment & Plan: Recommended oral B12, small capsule.  2000 mcg daily.  Orders: -     Vitamin B-12; Take 2 tablets (2,000 mcg total) by  mouth daily.  Dispense: 60 tablet; Refill: 3  Gastroesophageal  reflux disease without esophagitis Assessment & Plan: Stable.  Omeprazole  refilled.  Orders: -     Omeprazole ; Take 1 capsule (20 mg total) by mouth daily.  Dispense: 90 capsule; Refill: 3    Follow-up: Follow-up in 1 month  Kenedy Haisley DO [facility] "

## 2024-06-30 NOTE — Assessment & Plan Note (Addendum)
Stable. Omeprazole refilled.  

## 2024-08-01 ENCOUNTER — Ambulatory Visit: Payer: Self-pay | Admitting: Family Medicine

## 2025-07-06 ENCOUNTER — Ambulatory Visit: Payer: Self-pay
